# Patient Record
Sex: Female | Born: 1980 | Race: White | Hispanic: No | Marital: Married | Smoking: Never smoker
Health system: Southern US, Community
[De-identification: ages and names within clinical notes are randomized; demographics above are authoritative.]

## PROBLEM LIST (undated history)

## (undated) DIAGNOSIS — K8020 Calculus of gallbladder without cholecystitis without obstruction: Secondary | ICD-10-CM

## (undated) DIAGNOSIS — E282 Polycystic ovarian syndrome: Secondary | ICD-10-CM

## (undated) DIAGNOSIS — F32A Depression, unspecified: Secondary | ICD-10-CM

## (undated) DIAGNOSIS — F329 Major depressive disorder, single episode, unspecified: Secondary | ICD-10-CM

## (undated) DIAGNOSIS — N879 Dysplasia of cervix uteri, unspecified: Secondary | ICD-10-CM

## (undated) DIAGNOSIS — I10 Essential (primary) hypertension: Secondary | ICD-10-CM

## (undated) DIAGNOSIS — D649 Anemia, unspecified: Secondary | ICD-10-CM

## (undated) DIAGNOSIS — E669 Obesity, unspecified: Secondary | ICD-10-CM

## (undated) DIAGNOSIS — A6000 Herpesviral infection of urogenital system, unspecified: Secondary | ICD-10-CM

## (undated) HISTORY — DX: Polycystic ovarian syndrome: E28.2

## (undated) HISTORY — DX: Obesity, unspecified: E66.9

## (undated) HISTORY — DX: Major depressive disorder, single episode, unspecified: F32.9

## (undated) HISTORY — DX: Herpesviral infection of urogenital system, unspecified: A60.00

## (undated) HISTORY — DX: Dysplasia of cervix uteri, unspecified: N87.9

## (undated) HISTORY — DX: Depression, unspecified: F32.A

---

## 2005-11-22 ENCOUNTER — Emergency Department (HOSPITAL_COMMUNITY): Admission: EM | Admit: 2005-11-22 | Discharge: 2005-11-22 | Payer: Self-pay | Admitting: Emergency Medicine

## 2005-12-28 ENCOUNTER — Emergency Department (HOSPITAL_COMMUNITY): Admission: EM | Admit: 2005-12-28 | Discharge: 2005-12-28 | Payer: Self-pay | Admitting: Emergency Medicine

## 2006-02-06 ENCOUNTER — Emergency Department (HOSPITAL_COMMUNITY): Admission: EM | Admit: 2006-02-06 | Discharge: 2006-02-06 | Payer: Self-pay | Admitting: Family Medicine

## 2006-05-17 ENCOUNTER — Emergency Department (HOSPITAL_COMMUNITY): Admission: EM | Admit: 2006-05-17 | Discharge: 2006-05-17 | Payer: Self-pay | Admitting: Family Medicine

## 2006-07-28 ENCOUNTER — Inpatient Hospital Stay (HOSPITAL_COMMUNITY): Admission: AD | Admit: 2006-07-28 | Discharge: 2006-07-28 | Payer: Self-pay | Admitting: Obstetrics and Gynecology

## 2006-10-03 ENCOUNTER — Inpatient Hospital Stay (HOSPITAL_COMMUNITY): Admission: AD | Admit: 2006-10-03 | Discharge: 2006-10-03 | Payer: Self-pay | Admitting: Obstetrics & Gynecology

## 2006-10-10 ENCOUNTER — Inpatient Hospital Stay (HOSPITAL_COMMUNITY): Admission: AD | Admit: 2006-10-10 | Discharge: 2006-10-10 | Payer: Self-pay | Admitting: Obstetrics & Gynecology

## 2006-10-11 ENCOUNTER — Encounter (INDEPENDENT_AMBULATORY_CARE_PROVIDER_SITE_OTHER): Payer: Self-pay | Admitting: Obstetrics and Gynecology

## 2006-10-11 ENCOUNTER — Inpatient Hospital Stay (HOSPITAL_COMMUNITY): Admission: RE | Admit: 2006-10-11 | Discharge: 2006-10-14 | Payer: Self-pay | Admitting: Obstetrics and Gynecology

## 2007-02-26 ENCOUNTER — Emergency Department (HOSPITAL_COMMUNITY): Admission: EM | Admit: 2007-02-26 | Discharge: 2007-02-26 | Payer: Self-pay | Admitting: Emergency Medicine

## 2007-11-14 ENCOUNTER — Emergency Department (HOSPITAL_COMMUNITY): Admission: EM | Admit: 2007-11-14 | Discharge: 2007-11-14 | Payer: Self-pay | Admitting: Family Medicine

## 2008-02-19 ENCOUNTER — Emergency Department (HOSPITAL_COMMUNITY): Admission: EM | Admit: 2008-02-19 | Discharge: 2008-02-19 | Payer: Self-pay | Admitting: Emergency Medicine

## 2008-04-25 HISTORY — PX: TUBAL LIGATION: SHX77

## 2009-04-02 ENCOUNTER — Ambulatory Visit (HOSPITAL_COMMUNITY): Admission: RE | Admit: 2009-04-02 | Discharge: 2009-04-02 | Payer: Self-pay | Admitting: Obstetrics and Gynecology

## 2009-06-11 ENCOUNTER — Emergency Department (HOSPITAL_COMMUNITY): Admission: EM | Admit: 2009-06-11 | Discharge: 2009-06-11 | Payer: Self-pay | Admitting: Emergency Medicine

## 2010-07-27 LAB — TYPE AND SCREEN

## 2010-07-27 LAB — CBC
HCT: 40.9 % (ref 36.0–46.0)
MCHC: 33.6 g/dL (ref 30.0–36.0)
Platelets: 208 10*3/uL (ref 150–400)
RDW: 12.6 % (ref 11.5–15.5)

## 2010-08-06 ENCOUNTER — Ambulatory Visit: Payer: Self-pay | Admitting: Family Medicine

## 2010-09-07 NOTE — Op Note (Signed)
[name], ROSTAD                ACCOUNT NO.:  [id number]   MEDICAL RECORD NO.:  [id number]          PATIENT TYPE:  INP   LOCATION:  9105                          FACILITY:  [facility]   PHYSICIAN:  Maxie Better, M.D.DATE OF BIRTH:  June 12, 1980   DATE OF PROCEDURE:  10/11/2006  DATE OF DISCHARGE:                               OPERATIVE REPORT   PREOPERATIVE DIAGNOSIS:  Previous cesarean section, pregnancy-induced  hypertension, intrauterine gestation at 38 weeks.   PROCEDURES:  Repeat cesarean section Kerr hysterotomy.   POSTOPERATIVE DIAGNOSIS:  Previous cesarean section, term gestation.  Pregnancy induced hypertension.   ANESTHESIA:  Spinal.   SURGEON:  Maxie Better, M.D.   ASSISTANT:  Marlinda Mike, C.N.M.   PROCEDURE:  Under adequate spinal anesthesia the patient is placed in  the supine position with a left lateral tilt.  She was sterilely prepped  and draped in usual fashion.  Indwelling Foley catheter was sterilely  placed.  Quarter percent Marcaine was injected along the previous  Pfannenstiel skin incision site.  Pfannenstiel skin incision was made  through the previous scar and carried down to the rectus fascia.  The  rectus fascia was opened transversely.  The rectus fascia was then  bluntly and sharply dissected off the rectus muscle in superior and  inferior fashion.  The rectus muscle was fused in the midline and that  was carefully opened superiorly and inferiorly. The parietal peritoneum  was entered bluntly and extended superiorly and inferiorly.  The  vesicouterine peritoneum was opened transversely.  The bladder was  bluntly dissected off the lower uterine segment.  The self-retaining  retractor for cesarean section was placed.  A curvilinear low transverse  uterine incision was then made and extended with bandage scissors.  Artificial rupture of membranes was performed.  Clear copious amniotic  fluid was noted.  Subsequent delivery of a live female  from the occiput  anterior presentation was accomplished.  Baby was bulb suctioned on the  abdomen. Cord was clamped, cut. The baby was transferred to the waiting  pediatricians who was assigned Apgars of 9 and 9 at 1 and 5 minutes. The  placenta which was posterior was spontaneous and was intact, sent to  pathology.  Uterine cavity was cleaned of the debris.  Uterine incision  had no extension.  Uterine incision was closed in two layers the first  layer 0 Monocryl running locked stitch.  Second layer was imbricated  using 0 Monocryl suture. The abdomen was then copiously irrigated and  suctioned.  The self retaining retractor was removed.  Normal tubes and  ovaries were noted.  Bilateral omental adhesion anteriorly on the  anterior wall on the left was lysed.  The parietal peritoneum was closed  with 2-0 Vicryl suture.  The rectus fascia was closed with 0 Vicryl x2.  The scar tissue in the subcutaneous area was removed.  The subcutaneous  area was irrigated, small bleeders cauterized and the subcutaneous area  was closed with several layers of 2-0 plain sutures.  Skin was  approximated using Ethicon staples.  Specimen was placenta sent to  pathology.  Estimated blood loss was 700 mL.  Intraoperative fluid was 3 liters, urine output was 100 mL clear yellow  urine.  Sponge and instrument counts x2 was correct.  Complication was  none.  The patient tolerated procedure well and was transferred to  recovery in stable condition.      Maxie Better, M.D.  Electronically Signed     [place]/MEDQ  D:  10/11/2006  T:  10/11/2006  Job:  [id number]

## 2010-09-10 NOTE — Discharge Summary (Signed)
[name], [name]                ACCOUNT NO.:  [id number]   MEDICAL RECORD NO.:  [id number]          PATIENT TYPE:  INP   LOCATION:  9105                          [facility]  [facility]   PHYSICIAN:  Maxie Better, M.D.DATE OF BIRTH:  1981-01-30   DATE OF ADMISSION:  10/11/2006  DATE OF DISCHARGE:  10/14/2006                               DISCHARGE SUMMARY   ADMISSION DIAGNOSES:  1. Previous cesarean section.  2. Intrauterine gestation at 38 weeks.  3. Pregnancy-induced hypertension.   DISCHARGE DIAGNOSES:  1. Term gestation, delivered.  2. Previous cesarean section.  3. Pregnancy-induced hypertension.   PROCEDURE:  Repeat cesarean section.   HOSPITAL COURSE:  The patient was admitted to [facility], a 30-year-  old, G2, P1-0-0-1 female at 56 weeks who has had a previous cesarean  section.  The patient is noted to have had increased blood pressures.  Her prenatal course has been notable for gestational thrombocytopenia.  The patient was due to have a repeat cesarean section due to the  diagnosis of PIH.  The patient was taken to the operating room where she  had a repeat cesarean section, live female, 7 pounds 7 ounces was born  with Apgar's of 9 and 9, normal tubes and ovaries are noted.  Please see  the dictated operative note for any other details.   The patient did well postoperatively.  Her blood pressures ranged on  postop day #1 between 128-153/80-87.  CBC on postop day #1 showed a  hemoglobin of 10.5, hematocrit of 31.4, platelet count 149,000, white  count 10.1.  The patient did not require any magnesium sulfate.  By  postop day #3, the patient continued to do well.  Her blood pressures  were stable.  Her incision had no erythema, induration or exudate.  Staples were still in place.  She was deemed well enough to be  discharged home.   DISPOSITION:  Home.   CONDITION ON DISCHARGE:  Stable.   DISCHARGE MEDICATIONS:  1. Motrin 800 mg one p.o. q.6-8h. p.r.n.  pain.  2. Tylox one to two tablets every 3-4 hours p.r.n. pain.  3. Continue prenatal vitamins one p.o. daily.   SPECIAL INSTRUCTIONS:  Discharge instructions per the postpartum booklet  given.   FOLLOW UP:  Followup appointment at [facility] in 6 weeks and for  staple removal the following Tuesday.      Maxie Better, M.D.  Electronically Signed     [place]/MEDQ  D:  11/22/2006  T:  11/23/2006  Job:  [id number]

## 2010-09-10 NOTE — Consult Note (Signed)
[name], [name]                ACCOUNT NO.:  [id number]   MEDICAL RECORD NO.:  [id number]          PATIENT TYPE:  MAT   LOCATION:  MATC                          [facility]ILITY:  [facility]   PHYSICIAN:  Lenoard Aden, M.D.DATE OF BIRTH:  01-Oct-1980   DATE OF CONSULTATION:  DATE OF DISCHARGE:                                 CONSULTATION   CHIEF COMPLAINT:  Elevated blood pressure.   HISTORY OF PRESENT ILLNESS:  She is a 30 year old Caucasian female, G2,  P1, who presents at 27 weeks to the office with elevated blood pressure  today and presents for evaluation to rule out preeclampsia.   MEDICATIONS:  She has no known drug allergies.   MEDICATIONS:  1. Prenatal vitamins.  2. Zoloft.   SOCIAL HISTORY:  She is a nonsmoker, nondrinker.  Denies __________.   PAST MEDICAL HISTORY:  1. History of C-section.  2. Pilonidal cyst removal.  3. Cryosurgery.  4. HSV, currently asymptomatic.   OB/GYN HISTORY:  Remarkable for C-section x1.   PHYSICAL EXAMINATION:  GENERAL:  She is a well-developed, well-nourished  white female in no acute distress.  VITAL SIGNS:  Blood pressure 130s to 140s over 70s.  HEENT:  Normal.  LUNGS:  Clear.  HEART:  Regular rhythm.  ABDOMEN:  Soft, gravid, nontender.  PELVIC:  Cervical exam deferred.  EXTREMITIES:  Show no cords.  NEUROLOGIC:  Cranial nerves nonfocal.  SKIN:  Intact.  NECK:  Supple with full range of motion.   DIAGNOSTIC AND LABORATORY DATA:  NST is reassuring.  Ultrasound reveals  appropriately growing fetus with normal AFI.   IMPRESSION:  A 27-week pregnancy with no evidence of preeclampsia, with  normal pregnancy-induced hypertension panel, normal ultrasound,  reassuring fetal surveillance.   PLAN:  Precautions given.  Follow up in the office within 1 week.      Lenoard Aden, M.D.  Electronically Signed     [facility]/MEDQ  D:  07/28/2006  T:  07/28/2006  Job:  [id number]

## 2011-02-01 LAB — POCT RAPID STREP A: Streptococcus, Group A Screen (Direct): POSITIVE — AB

## 2011-02-09 ENCOUNTER — Inpatient Hospital Stay (INDEPENDENT_AMBULATORY_CARE_PROVIDER_SITE_OTHER)
Admission: RE | Admit: 2011-02-09 | Discharge: 2011-02-09 | Disposition: A | Discharge status: Home / Self Care | Payer: 59 | Source: Ambulatory Visit | Attending: Emergency Medicine | Admitting: Emergency Medicine

## 2011-02-09 DIAGNOSIS — J029 Acute pharyngitis, unspecified: Secondary | ICD-10-CM

## 2011-02-09 LAB — CBC
HCT: 36.1
MCHC: 33.6
MCHC: 33.7
MCV: 92.8
MCV: 93
Platelets: 149 — ABNORMAL LOW
Platelets: 170
RBC: 3.72 — ABNORMAL LOW
RDW: 13.9
WBC: 7.8

## 2011-02-09 LAB — POCT INFECTIOUS MONO SCREEN: Mono Screen: NEGATIVE

## 2011-02-09 LAB — COMPREHENSIVE METABOLIC PANEL
ALT: 13
AST: 17
Albumin: 2.6 — ABNORMAL LOW
Alkaline Phosphatase: 102
CO2: 29
Chloride: 105
GFR calc Af Amer: >60
GFR calc non Af Amer: >60
Potassium: 3.3 — ABNORMAL LOW
Sodium: 138
Total Bilirubin: 0.3

## 2011-02-09 LAB — RPR: RPR Ser Ql: NONREACTIVE

## 2011-02-10 LAB — COMPREHENSIVE METABOLIC PANEL
ALT: 16
AST: 15
Alkaline Phosphatase: 109
CO2: 28
Calcium: 9.4
GFR calc Af Amer: >60
Glucose, Bld: 92
Potassium: 3.7
Sodium: 140
Total Protein: 5.3 — ABNORMAL LOW

## 2011-02-10 LAB — CBC
Hemoglobin: 11.8 — ABNORMAL LOW
RBC: 3.72 — ABNORMAL LOW
RDW: 13.6

## 2011-02-10 LAB — LACTATE DEHYDROGENASE: LDH: 140

## 2011-04-07 ENCOUNTER — Other Ambulatory Visit (HOSPITAL_COMMUNITY)
Admission: RE | Admit: 2011-04-07 | Discharge: 2011-04-07 | Disposition: A | Discharge status: Home / Self Care | Payer: 59 | Source: Ambulatory Visit | Attending: Obstetrics and Gynecology | Admitting: Obstetrics and Gynecology

## 2011-04-07 ENCOUNTER — Other Ambulatory Visit: Payer: Self-pay | Admitting: Obstetrics and Gynecology

## 2011-04-07 DIAGNOSIS — Z01419 Encounter for gynecological examination (general) (routine) without abnormal findings: Secondary | ICD-10-CM | POA: Insufficient documentation

## 2011-04-07 DIAGNOSIS — R87810 Cervical high risk human papillomavirus (HPV) DNA test positive: Secondary | ICD-10-CM | POA: Insufficient documentation

## 2011-05-18 ENCOUNTER — Other Ambulatory Visit: Payer: Self-pay | Admitting: Nurse Practitioner

## 2011-11-16 ENCOUNTER — Other Ambulatory Visit: Payer: Self-pay | Admitting: Obstetrics and Gynecology

## 2011-11-16 ENCOUNTER — Other Ambulatory Visit (HOSPITAL_COMMUNITY)
Admission: RE | Admit: 2011-11-16 | Discharge: 2011-11-16 | Disposition: A | Discharge status: Home / Self Care | Payer: 59 | Source: Ambulatory Visit | Attending: Obstetrics and Gynecology | Admitting: Obstetrics and Gynecology

## 2011-11-16 DIAGNOSIS — Z01419 Encounter for gynecological examination (general) (routine) without abnormal findings: Secondary | ICD-10-CM | POA: Insufficient documentation

## 2012-04-25 HISTORY — PX: COLPOSCOPY: SHX161

## 2012-05-24 ENCOUNTER — Other Ambulatory Visit (HOSPITAL_COMMUNITY)
Admission: RE | Admit: 2012-05-24 | Discharge: 2012-05-24 | Disposition: A | Discharge status: Home / Self Care | Payer: 59 | Source: Ambulatory Visit | Attending: Obstetrics and Gynecology | Admitting: Obstetrics and Gynecology

## 2012-05-24 ENCOUNTER — Other Ambulatory Visit: Payer: Self-pay | Admitting: Obstetrics and Gynecology

## 2012-05-24 DIAGNOSIS — Z01419 Encounter for gynecological examination (general) (routine) without abnormal findings: Secondary | ICD-10-CM | POA: Insufficient documentation

## 2012-05-24 DIAGNOSIS — Z1151 Encounter for screening for human papillomavirus (HPV): Secondary | ICD-10-CM | POA: Insufficient documentation

## 2012-11-15 ENCOUNTER — Other Ambulatory Visit: Payer: Self-pay | Admitting: Obstetrics and Gynecology

## 2013-08-26 ENCOUNTER — Ambulatory Visit: Payer: 59 | Admitting: Obstetrics & Gynecology

## 2013-12-30 ENCOUNTER — Emergency Department (HOSPITAL_COMMUNITY)
Admission: EM | Admit: 2013-12-30 | Discharge: 2013-12-30 | Disposition: A | Discharge status: Home / Self Care | Payer: 59 | Attending: Emergency Medicine | Admitting: Emergency Medicine

## 2013-12-30 ENCOUNTER — Emergency Department (HOSPITAL_COMMUNITY): Payer: 59

## 2013-12-30 ENCOUNTER — Encounter (HOSPITAL_COMMUNITY): Payer: Self-pay | Admitting: Emergency Medicine

## 2013-12-30 DIAGNOSIS — R110 Nausea: Secondary | ICD-10-CM | POA: Insufficient documentation

## 2013-12-30 DIAGNOSIS — J3489 Other specified disorders of nose and nasal sinuses: Secondary | ICD-10-CM | POA: Insufficient documentation

## 2013-12-30 DIAGNOSIS — Z880 Allergy status to penicillin: Secondary | ICD-10-CM | POA: Insufficient documentation

## 2013-12-30 DIAGNOSIS — J069 Acute upper respiratory infection, unspecified: Secondary | ICD-10-CM | POA: Diagnosis not present

## 2013-12-30 DIAGNOSIS — R498 Other voice and resonance disorders: Secondary | ICD-10-CM | POA: Insufficient documentation

## 2013-12-30 MED ORDER — AZITHROMYCIN 250 MG PO TABS: 250.0000 mg | ORAL_TABLET | Freq: Every day | ORAL | Status: DC

## 2013-12-30 NOTE — Discharge Instructions (Signed)
Laryngitis At the top of your windpipe is your voice box. It is the source of your voice. Inside your voice box are 2 bands of muscles called vocal cords. When you breathe, your vocal cords are relaxed and open so that air can get into the lungs. When you decide to say something, these cords come together and vibrate. The sound from these vibrations goes into your throat and comes out through your mouth as sound. Laryngitis is an inflammation of the vocal cords that causes hoarseness, cough, loss of voice, sore throat, and dry throat. Laryngitis can be temporary (acute) or long-term (chronic). Most cases of acute laryngitis improve with time.Chronic laryngitis lasts for more than 3 weeks. CAUSES Laryngitis can often be related to excessive smoking, talking, or yelling, as well as inhalation of toxic fumes and allergies. Acute laryngitis is usually caused by a viral infection, vocal strain, measles or mumps, or bacterial infections. Chronic laryngitis is usually caused by vocal cord strain, vocal cord injury, postnasal drip, growths on the vocal cords, or acid reflux. SYMPTOMS   Cough.  Sore throat.  Dry throat. RISK FACTORS  Respiratory infections.  Exposure to irritating substances, such as cigarette smoke, excessive amounts of alcohol, stomach acids, and workplace chemicals.  Voice trauma, such as vocal cord injury from shouting or speaking too loud. DIAGNOSIS  Your cargiver will perform a physical exam. During the physical exam, your caregiver will examine your throat. The most common sign of laryngitis is hoarseness. Laryngoscopy may be necessary to confirm the diagnosis of this condition. This procedure allows your caregiver to look into the larynx. HOME CARE INSTRUCTIONS  Drink enough fluids to keep your urine clear or pale yellow.  Rest until you no longer have symptoms or as directed by your caregiver.  Breathe in moist air.  Take all medicine as directed by your  caregiver.  Do not smoke.  Talk as little as possible (this includes whispering).  Write on paper instead of talking until your voice is back to normal.  Follow up with your caregiver if your condition has not improved after 10 days. SEEK MEDICAL CARE IF:   You have trouble breathing.  You cough up blood.  You have persistent fever.  You have increasing pain.  You have difficulty swallowing. MAKE SURE YOU:  Understand these instructions.  Will watch your condition.  Will get help right away if you are not doing well or get worse. Document Released: 04/11/2005 Document Revised: 07/04/2011 Document Reviewed: 06/17/2010 [facility] Patient Information 2015 [place], [place]. This information is not intended to replace advice given to you by your health care provider. Make sure you discuss any questions you have with your health care provider. Upper Respiratory Infection, Adult An upper respiratory infection (URI) is also known as the common cold. It is often caused by a type of germ (virus). Colds are easily spread (contagious). You can pass it to others by kissing, coughing, sneezing, or drinking out of the same glass. Usually, you get better in 1 or 2 weeks.  HOME CARE   Only take medicine as told by your doctor.  Use a warm mist humidifier or breathe in steam from a hot shower.  Drink enough water and fluids to keep your pee (urine) clear or pale yellow.  Get plenty of rest.  Return to work when your temperature is back to normal or as told by your doctor. You may use a face mask and wash your hands to stop your cold from spreading. GET  GET HELP RIGHT AWAY IF:  °· After the first few days, you feel you are getting worse. °· You have questions about your medicine. °· You have chills, shortness of breath, or brown or red spit (mucus). °· You have yellow or brown snot (nasal discharge) or pain in the face, especially when you bend forward. °· You have a fever, puffy (swollen) neck,  pain when you swallow, or white spots in the back of your throat. °· You have a bad headache, ear pain, sinus pain, or chest pain. °· You have a high-pitched whistling sound when you breathe in and out (wheezing). °· You have a lasting cough or cough up blood. °· You have sore muscles or a stiff neck. °MAKE SURE YOU:  °· Understand these instructions. °· Will watch your condition. °· Will get help right away if you are not doing well or get worse. °Document Released: 09/28/2007 Document Revised: 07/04/2011 Document Reviewed: 07/17/2013 °[employer]® Patient Information ©2015 [employer]. This information is not intended to replace advice given to you by your health care provider. Make sure you discuss any questions you have with your health care provider. ° °

## 2013-12-30 NOTE — ED Provider Notes (Signed)
Medical screening examination/treatment/procedure(s) were performed by non-physician practitioner and as supervising physician I was immediately available for consultation/collaboration.    Vida Roller, MD 12/30/13 [phone]

## 2013-12-30 NOTE — ED Notes (Signed)
Per pt sts congestion that started in her sinuses and has moved to her chest. sts green sputum.

## 2013-12-30 NOTE — ED Provider Notes (Signed)
CSN: [id number]     Arrival date & time 12/30/13  1144 History  This chart was scribed for non-physician practitioner Roxy Horseman, PA-C working with Vida Roller, MD by Leone Payor, ED Scribe. This patient was seen in room TR10C/TR10C and the patient's care was started at 1:23 PM.    Chief Complaint  Patient presents with  . URI    The history is provided by the patient. No language interpreter was used.    HPI Comments: [name] is a 33 y.o. female who presents to the Emergency Department complaining of 1 week of gradual onset, constant, gradually worsened congestion with associated rhinorrhea and cough productive of green sputum. She also reports associated hoarse voice. She has taken sudafed and mucinex with mild relief. She denies fever, chills, vomiting, chest pain, SOB.   History reviewed. No pertinent past medical history. Past Surgical History  Procedure Laterality Date  . Cesarean section    . Tubal ligation     History reviewed. No pertinent family history. History  Substance Use Topics  . Smoking status: Never Smoker   . Smokeless tobacco: Not on file  . Alcohol Use: No   OB History   Grav Para Term Preterm Abortions TAB SAB Ect Mult Living                 Review of Systems  HENT: Positive for congestion, rhinorrhea and voice change (hoarse).   Respiratory: Positive for cough. Negative for shortness of breath.   Cardiovascular: Negative for chest pain.  Gastrointestinal: Positive for nausea. Negative for vomiting.      Allergies  Penicillins  Home Medications   Prior to Admission medications   Not on File   BP 176/83  Pulse 84  Temp(Src) 98.5 F (36.9 C) (Oral)  Resp 16  Ht  (1.676 m)  Wt 246 lb (111.585 kg)  BMI 39.72 kg/m2  SpO2 98%  LMP 12/27/2013 Physical Exam  Nursing note and vitals reviewed. Constitutional: She is oriented to person, place, and time. She appears well-developed and well-nourished.  HENT:  Head: Normocephalic  and atraumatic.  Mouth/Throat: Oropharynx is clear and moist and mucous membranes are normal. No oropharyngeal exudate, posterior oropharyngeal edema, posterior oropharyngeal erythema or tonsillar abscesses.  No abscess, no stridor, oropharynx is clear  Cardiovascular: Normal rate, regular rhythm and normal heart sounds.  Exam reveals no gallop and no friction rub.   No murmur heard. Pulmonary/Chest: Effort normal and breath sounds normal. No respiratory distress. She has no wheezes. She has no rales.  Abdominal: She exhibits no distension.  Neurological: She is alert and oriented to person, place, and time.  Skin: Skin is warm and dry.  Psychiatric: She has a normal mood and affect.    ED Course  Procedures (including critical care time)  DIAGNOSTIC STUDIES: Oxygen Saturation is 98% on RA, normal by my interpretation.    COORDINATION OF CARE: 1:27 PM Discussed treatment plan with pt at bedside and pt agreed to plan.   Labs Review Labs Reviewed - No data to display  Imaging Review Dg Chest 2 View  12/30/2013   CLINICAL DATA:  Cough and congestion for 4 days  EXAM: CHEST  2 VIEW  COMPARISON:  None.  FINDINGS: The heart size and mediastinal contours are within normal limits. Both lungs are clear but lung volumes are low with crowding of the bronchovascular markings. The visualized skeletal structures are unremarkable.  IMPRESSION: No active cardiopulmonary disease.   Electronically Signed  By: Christiana Pellant M.D.   On: 12/30/2013 13:16     EKG Interpretation None      MDM   Final diagnoses:  URI (upper respiratory infection)    Pt CXR negative for acute infiltrate. Patients symptoms are consistent with URI, likely viral etiology.  Pt will be discharged with symptomatic treatment.  Verbalizes understanding and is agreeable with plan. Pt is hemodynamically stable & in NAD prior to dc.   I personally performed the services described in this documentation, which was scribed in  my presence. The recorded information has been reviewed and is accurate.     Roxy Horseman, PA-C 12/30/13 1330

## 2014-02-04 ENCOUNTER — Ambulatory Visit: Payer: 59 | Admitting: *Deleted

## 2014-02-18 ENCOUNTER — Ambulatory Visit: Payer: 59 | Admitting: *Deleted

## 2014-03-24 ENCOUNTER — Ambulatory Visit: Payer: 59 | Admitting: *Deleted

## 2014-11-21 ENCOUNTER — Emergency Department (HOSPITAL_COMMUNITY)
Admission: EM | Admit: 2014-11-21 | Discharge: 2014-11-21 | Disposition: A | Discharge status: Home / Self Care | Payer: 59 | Attending: Emergency Medicine | Admitting: Emergency Medicine

## 2014-11-21 ENCOUNTER — Encounter (HOSPITAL_COMMUNITY): Payer: Self-pay | Admitting: General Practice

## 2014-11-21 DIAGNOSIS — Y999 Unspecified external cause status: Secondary | ICD-10-CM | POA: Insufficient documentation

## 2014-11-21 DIAGNOSIS — X58XXXA Exposure to other specified factors, initial encounter: Secondary | ICD-10-CM | POA: Diagnosis not present

## 2014-11-21 DIAGNOSIS — S161XXA Strain of muscle, fascia and tendon at neck level, initial encounter: Secondary | ICD-10-CM | POA: Diagnosis not present

## 2014-11-21 DIAGNOSIS — Z880 Allergy status to penicillin: Secondary | ICD-10-CM | POA: Insufficient documentation

## 2014-11-21 DIAGNOSIS — Y939 Activity, unspecified: Secondary | ICD-10-CM | POA: Insufficient documentation

## 2014-11-21 DIAGNOSIS — M542 Cervicalgia: Secondary | ICD-10-CM | POA: Diagnosis present

## 2014-11-21 DIAGNOSIS — Y929 Unspecified place or not applicable: Secondary | ICD-10-CM | POA: Insufficient documentation

## 2014-11-21 DIAGNOSIS — Z8639 Personal history of other endocrine, nutritional and metabolic disease: Secondary | ICD-10-CM | POA: Insufficient documentation

## 2014-11-21 MED ORDER — METHOCARBAMOL 1000 MG/10ML IJ SOLN: 500.0000 mg | Freq: Once | INTRAMUSCULAR | Status: DC

## 2014-11-21 MED ORDER — KETOROLAC TROMETHAMINE 30 MG/ML IJ SOLN
60.0000 mg | Freq: Once | INTRAMUSCULAR | Status: AC
  Administered 2014-11-21: 60 mg via INTRAMUSCULAR
  Filled 2014-11-21: qty 2

## 2014-11-21 MED ORDER — CYCLOBENZAPRINE HCL 10 MG PO TABS: 10.0000 mg | ORAL_TABLET | Freq: Three times a day (TID) | ORAL | Status: DC | PRN

## 2014-11-21 MED ORDER — ORPHENADRINE CITRATE 30 MG/ML IJ SOLN: 60.0000 mg | Freq: Two times a day (BID) | INTRAMUSCULAR | Status: DC

## 2014-11-21 NOTE — ED Notes (Signed)
Pt complaining of neck pain that started Sunday evening. Pain is worse with movement, pt denies any traumatic event. Pt is A/O. Pt denies N/V, or HA. Pt reporting left arm numbness and tingling that started last night.

## 2014-11-21 NOTE — ED Notes (Signed)
Pt getting undressed and into a gown; 2 warm blankets given; Baxter Hire, NT aware

## 2014-11-21 NOTE — ED Provider Notes (Signed)
CSN: [id number]     Arrival date & time 11/21/14  4098 History   First MD Initiated Contact with Patient 11/21/14 1000     Chief Complaint  Patient presents with  . Neck Pain     (Consider location/radiation/quality/duration/timing/severity/associated sxs/prior Treatment) HPI  Ms. Shamoon is a 34yo F with PMH of PCOS who presents with progressively worsening neck pain over the past 5 days along with 'pins and needles' in her left arm that started last night. She denies any trauma or excessive activity preceding when her symptoms started 5 days ago. She describes the pain as a sharp stabbing pain in her lower neck that radiates to her shoulders, particularly worse when she turns her head to the right. She has tried ibuprofen and Aleve as well as heat and cold, none of which have helped. She says lying flat on her back does help. The pain is now 'almost unbearable', an 8/10, and she came in today because of the new numbness and tingling in her arm that started last night. She denies trauma, fevers, chills, weight changes, headache, visual symptoms, bowel or bladder symptoms, or any weakness. She has no other issues at this time.  Past Medical History  Diagnosis Date  . Polycystic ovary syndrome    Past Surgical History  Procedure Laterality Date  . Cesarean section    . Tubal ligation     No family history on file. History  Substance Use Topics  . Smoking status: Never Smoker   . Smokeless tobacco: Not on file  . Alcohol Use: No   OB History    No data available     Review of Systems  Constitutional: Negative for fever, chills and diaphoresis.  HENT: Negative for ear pain, sinus pressure and sore throat.   Eyes: Negative for visual disturbance.  Respiratory: Negative for cough, shortness of breath and wheezing.   Cardiovascular: Negative for chest pain, palpitations and leg swelling.  Gastrointestinal: Negative for nausea, vomiting, abdominal pain, diarrhea, constipation and blood  in stool.  Endocrine: Negative for polyuria.  Genitourinary: Negative for dysuria, urgency, frequency, flank pain, decreased urine volume and difficulty urinating.  Musculoskeletal: Positive for neck pain. Negative for back pain, gait problem and neck stiffness.  Skin: Negative for rash.  Neurological: Positive for numbness. Negative for dizziness, syncope, weakness and headaches.  Hematological: Does not bruise/bleed easily.  Psychiatric/Behavioral: Negative for confusion and agitation.  All other systems reviewed and are negative.     Allergies  Penicillins  Home Medications   Prior to Admission medications   Medication Sig Start Date End Date Taking? Authorizing Provider  azithromycin (ZITHROMAX Z-PAK) 250 MG tablet Take 1 tablet (250 mg total) by mouth daily.  PO day 1, then  PO days 205 12/30/13   Roxy Horseman, PA-C   BP 161/104 mmHg  Pulse 72  Temp(Src) 98.8 F (37.1 C) (Oral)  Resp 18  Ht  (1.702 m)  Wt 265 lb (120.203 kg)  BMI 41.50 kg/m2  SpO2 99%  LMP 11/05/2014 Physical Exam  Constitutional: She is oriented to person, place, and time. She appears well-nourished. No distress.  HENT:  Head: Normocephalic and atraumatic.  Right Ear: External ear normal.  Left Ear: External ear normal.  Eyes: Conjunctivae are normal. Pupils are equal, round, and reactive to light.  Neck: Normal range of motion. Neck supple.  Cardiovascular: Normal rate, regular rhythm, normal heart sounds and intact distal pulses.  Exam reveals no gallop and no friction rub.  No murmur heard. Pulmonary/Chest: Effort normal and breath sounds normal. She has no wheezes. She has no rales.  Abdominal: Soft. Bowel sounds are normal. She exhibits no distension. There is no tenderness.  Musculoskeletal: She exhibits no edema or tenderness.  Neck is non-tender throughout. There is no bony or paravertebral tenderness. Spurling's negative on both sides. Lhermitte's sign negative. Pain worse  with rotation of head to the right, decreased ROM. Normal head rotation to the left.   Neurological: She is alert and oriented to person, place, and time. She has normal reflexes. No cranial nerve deficit. Coordination normal.  Decreased sensation to light touch in the posterior left arm, down the posterior forearm, and the entire hand. Strength is 5/5 symmetric throughout both upper extremities and lower extremities.  Skin: Skin is warm. No rash noted. She is not diaphoretic. No erythema.  Psychiatric: She has a normal mood and affect. Her behavior is normal.  Nursing note and vitals reviewed.   ED Course  Procedures (including critical care time) Labs Review Labs Reviewed - No data to display  Imaging Review No results found.   EKG Interpretation None      MDM   Final diagnoses:  None    Ms. Woolford is a 34yo F with PMH of PCOS who presents with progressively worsening neck pain over the past 5 days along with 'pins and needles' in her left arm that started last night without a history of trauma or constitutional signs. Exam shows worsened symptoms with head rotation opposite of her affected arm with intact strength. Most likely cervical strain, however peripheral neuropathy may also explain symptoms. We have discussed with the patient that an MRI of her neck may be warranted if her symptoms do not resolve, but would not change management here in the ED. We have given IM toradol with good response with instructions to follow-up outpatient for further management.    Darrick Huntsman, MD 11/21/14 1051  Darrick Huntsman, MD 11/21/14 1053  Darrick Huntsman, MD 11/21/14 1610  Lyndal Pulley, MD 11/21/14 1332

## 2014-11-21 NOTE — ED Provider Notes (Signed)
I saw and evaluated the patient, reviewed the resident's note and I agree with the findings and plan.  34 year old otherwise healthy female presents with nontraumatic neck pain and paresthesia of left outer arm. No hard neurologic signs or symptoms and no indication for emergent MRI. Will treat conservatively with NSAIDs and muscle relaxants. PCP follow-up for outpatient imaging as indicated.   EKG Interpretation None        Lyndal Pulley, MD 11/21/14 1036

## 2014-11-21 NOTE — Discharge Instructions (Signed)
Cervical Sprain A cervical sprain is when the tissues (ligaments) that hold the neck bones in place stretch or tear. HOME CARE   Put ice on the injured area.  Put ice in a plastic bag.  Place a towel between your skin and the bag.  Leave the ice on for 15-20 minutes, 3-4 times a day.  You may have been given a collar to wear. This collar keeps your neck from moving while you heal.  Do not take the collar off unless told by your doctor.  If you have long hair, keep it outside of the collar.  Ask your doctor before changing the position of your collar. You may need to change its position over time to make it more comfortable.  If you are allowed to take off the collar for cleaning or bathing, follow your doctor's instructions on how to do it safely.  Keep your collar clean by wiping it with mild soap and water. Dry it completely. If the collar has removable pads, remove them every 1-2 days to hand wash them with soap and water. Allow them to air dry. They should be dry before you wear them in the collar.  Do not drive while wearing the collar.  Only take medicine as told by your doctor.  Keep all doctor visits as told.  Keep all physical therapy visits as told.  Adjust your work station so that you have good posture while you work.  Avoid positions and activities that make your problems worse.  Warm up and stretch before being active. GET HELP IF:  Your pain is not controlled with medicine.  You cannot take less pain medicine over time as planned.  Your activity level does not improve as expected. GET HELP RIGHT AWAY IF:   You are bleeding.  Your stomach is upset.  You have an allergic reaction to your medicine.  You develop new problems that you cannot explain.  You lose feeling (become numb) or you cannot move any part of your body (paralysis).  You have tingling or weakness in any part of your body.  Your symptoms get worse. Symptoms include:  Pain,  soreness, stiffness, puffiness (swelling), or a burning feeling in your neck.  Pain when your neck is touched.  Shoulder or upper back pain.  Limited ability to move your neck.  Headache.  Dizziness.  Your hands or arms feel week, lose feeling, or tingle.  Muscle spasms.  Difficulty swallowing or chewing. MAKE SURE YOU:   Understand these instructions.  Will watch your condition.  Will get help right away if you are not doing well or get worse. Document Released: 09/28/2007 Document Revised: 12/12/2012 Document Reviewed: 10/17/2012 ExitCare Patient Information 2015 [employer]. This information is not intended to replace advice given to you by your health care provider. Make sure you discuss any questions you have with your health care provider.  

## 2015-01-16 ENCOUNTER — Encounter (HOSPITAL_COMMUNITY): Payer: Self-pay | Admitting: Cardiology

## 2015-01-16 ENCOUNTER — Emergency Department (HOSPITAL_COMMUNITY): Payer: 59

## 2015-01-16 ENCOUNTER — Emergency Department (HOSPITAL_COMMUNITY)
Admission: EM | Admit: 2015-01-16 | Discharge: 2015-01-16 | Disposition: A | Discharge status: Home / Self Care | Payer: 59 | Attending: Emergency Medicine | Admitting: Emergency Medicine

## 2015-01-16 DIAGNOSIS — R51 Headache: Secondary | ICD-10-CM | POA: Insufficient documentation

## 2015-01-16 DIAGNOSIS — I10 Essential (primary) hypertension: Secondary | ICD-10-CM | POA: Diagnosis not present

## 2015-01-16 DIAGNOSIS — Z79899 Other long term (current) drug therapy: Secondary | ICD-10-CM | POA: Insufficient documentation

## 2015-01-16 DIAGNOSIS — Z880 Allergy status to penicillin: Secondary | ICD-10-CM | POA: Insufficient documentation

## 2015-01-16 HISTORY — DX: Essential (primary) hypertension: I10

## 2015-01-16 LAB — CBC WITH DIFFERENTIAL/PLATELET
BASOS PCT: 0 %
Basophils Absolute: 0 10*3/uL (ref 0.0–0.1)
EOS ABS: 0.1 10*3/uL (ref 0.0–0.7)
Eosinophils Relative: 1 %
HCT: 37.5 % (ref 36.0–46.0)
Hemoglobin: 12.6 g/dL (ref 12.0–15.0)
Lymphocytes Relative: 38 %
Lymphs Abs: 2.5 10*3/uL (ref 0.7–4.0)
MCH: 29.6 pg (ref 26.0–34.0)
MCHC: 33.6 g/dL (ref 30.0–36.0)
MCV: 88 fL (ref 78.0–100.0)
MONOS PCT: 8 %
Monocytes Absolute: 0.5 10*3/uL (ref 0.1–1.0)
Neutro Abs: 3.5 10*3/uL (ref 1.7–7.7)
Neutrophils Relative %: 53 %
PLATELETS: 213 10*3/uL (ref 150–400)
RBC: 4.26 MIL/uL (ref 3.87–5.11)
RDW: 12.6 % (ref 11.5–15.5)
WBC: 6.6 10*3/uL (ref 4.0–10.5)

## 2015-01-16 LAB — BASIC METABOLIC PANEL
Anion gap: 7 (ref 5–15)
BUN: 8 mg/dL (ref 6–20)
CALCIUM: 9.3 mg/dL (ref 8.9–10.3)
CO2: 29 mmol/L (ref 22–32)
CREATININE: 0.69 mg/dL (ref 0.44–1.00)
Chloride: 103 mmol/L (ref 101–111)
GFR calc non Af Amer: >60 mL/min (ref >60)
GLUCOSE: 98 mg/dL (ref 65–99)
Potassium: 4.3 mmol/L (ref 3.5–5.1)
Sodium: 139 mmol/L (ref 135–145)

## 2015-01-16 MED ORDER — SODIUM CHLORIDE 0.9 % IV SOLN
INTRAVENOUS | Status: DC
  Administered 2015-01-16: 17:00:00 via INTRAVENOUS

## 2015-01-16 MED ORDER — AMLODIPINE BESYLATE 10 MG PO TABS: 10.0000 mg | ORAL_TABLET | Freq: Every day | ORAL | Status: DC

## 2015-01-16 MED ORDER — SODIUM CHLORIDE 0.9 % IV BOLUS (SEPSIS)
500.0000 mL | Freq: Once | INTRAVENOUS | Status: AC
  Administered 2015-01-16: 500 mL via INTRAVENOUS

## 2015-01-16 MED ORDER — KETOROLAC TROMETHAMINE 30 MG/ML IJ SOLN
30.0000 mg | Freq: Once | INTRAMUSCULAR | Status: AC
  Administered 2015-01-16: 30 mg via INTRAVENOUS
  Filled 2015-01-16: qty 1

## 2015-01-16 NOTE — ED Notes (Signed)
Pt. Going to CT

## 2015-01-16 NOTE — Discharge Instructions (Signed)
DASH Eating Plan DASH stands for "Dietary Approaches to Stop Hypertension." The DASH eating plan is a healthy eating plan that has been shown to reduce high blood pressure (hypertension). Additional health benefits may include reducing the risk of type 2 diabetes mellitus, heart disease, and stroke. The DASH eating plan may also help with weight loss. WHAT DO I NEED TO KNOW ABOUT THE DASH EATING PLAN? For the DASH eating plan, you will follow these general guidelines:  Choose foods with a percent daily value for sodium of less than 5% (as listed on the food label).  Use salt-free seasonings or herbs instead of table salt or sea salt.  Check with your health care provider or pharmacist before using salt substitutes.  Eat lower-sodium products, often labeled as "lower sodium" or "no salt added."  Eat fresh foods.  Eat more vegetables, fruits, and low-fat dairy products.  Choose whole grains. Look for the word "whole" as the first word in the ingredient list.  Choose fish and skinless chicken or turkey more often than red meat. Limit fish, poultry, and meat to 6 oz (170 g) each day.  Limit sweets, desserts, sugars, and sugary drinks.  Choose heart-healthy fats.  Limit cheese to 1 oz (28 g) per day.  Eat more home-cooked food and less restaurant, buffet, and fast food.  Limit fried foods.  Cook foods using methods other than frying.  Limit canned vegetables. If you do use them, rinse them well to decrease the sodium.  When eating at a restaurant, ask that your food be prepared with less salt, or no salt if possible. WHAT FOODS CAN I EAT? Seek help from a dietitian for individual calorie needs. Grains Whole grain or whole wheat bread. Brown rice. Whole grain or whole wheat pasta. Quinoa, bulgur, and whole grain cereals. Low-sodium cereals. Corn or whole wheat flour tortillas. Whole grain cornbread. Whole grain crackers. Low-sodium crackers. Vegetables Fresh or frozen vegetables  (raw, steamed, roasted, or grilled). Low-sodium or reduced-sodium tomato and vegetable juices. Low-sodium or reduced-sodium tomato sauce and paste. Low-sodium or reduced-sodium canned vegetables.  Fruits All fresh, canned (in natural juice), or frozen fruits. Meat and Other Protein Products Ground beef (85% or leaner), grass-fed beef, or beef trimmed of fat. Skinless chicken or turkey. Ground chicken or turkey. Pork trimmed of fat. All fish and seafood. Eggs. Dried beans, peas, or lentils. Unsalted nuts and seeds. Unsalted canned beans. Dairy Low-fat dairy products, such as skim or 1% milk, 2% or reduced-fat cheeses, low-fat ricotta or cottage cheese, or plain low-fat yogurt. Low-sodium or reduced-sodium cheeses. Fats and Oils Tub margarines without trans fats. Light or reduced-fat mayonnaise and salad dressings (reduced sodium). Avocado. Safflower, olive, or canola oils. Natural peanut or almond butter. Other Unsalted popcorn and pretzels. The items listed above may not be a complete list of recommended foods or beverages. Contact your dietitian for more options. WHAT FOODS ARE NOT RECOMMENDED? Grains White bread. White pasta. White rice. Refined cornbread. Bagels and croissants. Crackers that contain trans fat. Vegetables Creamed or fried vegetables. Vegetables in a cheese sauce. Regular canned vegetables. Regular canned tomato sauce and paste. Regular tomato and vegetable juices. Fruits Dried fruits. Canned fruit in light or heavy syrup. Fruit juice. Meat and Other Protein Products Fatty cuts of meat. Ribs, chicken wings, bacon, sausage, bologna, salami, chitterlings, fatback, hot dogs, bratwurst, and packaged luncheon meats. Salted nuts and seeds. Canned beans with salt. Dairy Whole or 2% milk, cream, half-and-half, and cream cheese. Whole-fat or sweetened yogurt. Full-fat   cheeses or blue cheese. Nondairy creamers and whipped toppings. Processed cheese, cheese spreads, or cheese  curds. Condiments Onion and garlic salt, seasoned salt, table salt, and sea salt. Canned and packaged gravies. Worcestershire sauce. Tartar sauce. Barbecue sauce. Teriyaki sauce. Soy sauce, including reduced sodium. Steak sauce. Fish sauce. Oyster sauce. Cocktail sauce. Horseradish. Ketchup and mustard. Meat flavorings and tenderizers. Bouillon cubes. Hot sauce. Tabasco sauce. Marinades. Taco seasonings. Relishes. Fats and Oils Butter, stick margarine, lard, shortening, ghee, and bacon fat. Coconut, palm kernel, or palm oils. Regular salad dressings. Other Pickles and olives. Salted popcorn and pretzels. The items listed above may not be a complete list of foods and beverages to avoid. Contact your dietitian for more information. WHERE CAN I FIND MORE INFORMATION? National Heart, Lung, and [employer] Institute: www.nhlbi.nih.gov/health/health-topics/topics/dash/ Document Released: 03/31/2011 Document Revised: 08/26/2013 Document Reviewed: 02/13/2013 ExitCare Patient Information 2015 [employer]. This information is not intended to replace advice given to you by your health care provider. Make sure you discuss any questions you have with your health care provider. Hypertension Hypertension, commonly called high blood pressure, is when the force of blood pumping through your arteries is too strong. Your arteries are the blood vessels that carry blood from your heart throughout your body. A blood pressure reading consists of a higher number over a lower number, such as 110/72. The higher number (systolic) is the pressure inside your arteries when your heart pumps. The lower number (diastolic) is the pressure inside your arteries when your heart relaxes. Ideally you want your blood pressure below 120/80. Hypertension forces your heart to work harder to pump blood. Your arteries may become narrow or stiff. Having hypertension puts you at risk for heart disease, stroke, and other problems.  RISK  FACTORS Some risk factors for high blood pressure are controllable. Others are not.  Risk factors you cannot control include:   Race. You may be at higher risk if you are African American.  Age. Risk increases with age.  Gender. Men are at higher risk than women before age 45 years. After age 65, women are at higher risk than men. Risk factors you can control include:  Not getting enough exercise or physical activity.  Being overweight.  Getting too much fat, sugar, calories, or salt in your diet.  Drinking too much alcohol. SIGNS AND SYMPTOMS Hypertension does not usually cause signs or symptoms. Extremely high blood pressure (hypertensive crisis) may cause headache, anxiety, shortness of breath, and nosebleed. DIAGNOSIS  To check if you have hypertension, your health care provider will measure your blood pressure while you are seated, with your arm held at the level of your heart. It should be measured at least twice using the same arm. Certain conditions can cause a difference in blood pressure between your right and left arms. A blood pressure reading that is higher than normal on one occasion does not mean that you need treatment. If one blood pressure reading is high, ask your health care provider about having it checked again. TREATMENT  Treating high blood pressure includes making lifestyle changes and possibly taking medicine. Living a healthy lifestyle can help lower high blood pressure. You may need to change some of your habits. Lifestyle changes may include:  Following the DASH diet. This diet is high in fruits, vegetables, and whole grains. It is low in salt, red meat, and added sugars.  Getting at least 2 hours of brisk physical activity every week.  Losing weight if necessary.  Not smoking.  Limiting   alcoholic beverages.  Learning ways to reduce stress. If lifestyle changes are not enough to get your blood pressure under control, your health care provider may  prescribe medicine. You may need to take more than one. Work closely with your health care provider to understand the risks and benefits. HOME CARE INSTRUCTIONS  Have your blood pressure rechecked as directed by your health care provider.   Take medicines only as directed by your health care provider. Follow the directions carefully. Blood pressure medicines must be taken as prescribed. The medicine does not work as well when you skip doses. Skipping doses also puts you at risk for problems.   Do not smoke.   Monitor your blood pressure at home as directed by your health care provider. SEEK MEDICAL CARE IF:   You think you are having a reaction to medicines taken.  You have recurrent headaches or feel dizzy.  You have swelling in your ankles.  You have trouble with your vision. SEEK IMMEDIATE MEDICAL CARE IF:  You develop a severe headache or confusion.  You have unusual weakness, numbness, or feel faint.  You have severe chest or abdominal pain.  You vomit repeatedly.  You have trouble breathing. MAKE SURE YOU:   Understand these instructions.  Will watch your condition.  Will get help right away if you are not doing well or get worse. Document Released: 04/11/2005 Document Revised: 08/26/2013 Document Reviewed: 02/01/2013 ExitCare Patient Information 2015 [employer]. This information is not intended to replace advice given to you by your health care provider. Make sure you discuss any questions you have with your health care provider.  

## 2015-01-16 NOTE — ED Notes (Signed)
Pt reports she was placed on HCTZ for HTN yesterday but her BP is still high this afternoon. Reports she was told to come over here for further evaluation. Reports a headache for 2 days.

## 2015-01-16 NOTE — ED Notes (Signed)
Pt back from CT

## 2015-01-16 NOTE — ED Provider Notes (Signed)
CSN: [id number]     Arrival date & time 01/16/15  1346 History   First MD Initiated Contact with Patient 01/16/15 1511     Chief Complaint  Patient presents with  . Hypertension  . Headache     (Consider location/radiation/quality/duration/timing/severity/associated sxs/prior Treatment) The history is provided by the patient.    [name] is a 34 y.o. female who presents for evaluation of headache present for 3 days and unrelenting despite taking ibuprofen. She is concerned that her blood pressure has been elevated, in the last week. She has been checking it, at work, and it has been as high as 180/120. She saw her gynecologist, 5 days ago and was prescribed hydrochlorothiazide, which she started yesterday. She was not known to have hypertension before that visit. She plans on seeing her PCP for follow-up, next week. She denies blurred vision, dizziness, nausea, vomiting, diaphoresis, chest pain. There are no other known modifying factors.  Past Medical History  Diagnosis Date  . Polycystic ovary syndrome   . Hypertension    Past Surgical History  Procedure Laterality Date  . Cesarean section    . Tubal ligation     History reviewed. No pertinent family history. Social History  Substance Use Topics  . Smoking status: Never Smoker   . Smokeless tobacco: None  . Alcohol Use: No   OB History    No data available     Review of Systems  All other systems reviewed and are negative.     Allergies  Penicillins  Home Medications   Prior to Admission medications   Medication Sig Start Date End Date Taking? Authorizing Provider  escitalopram (LEXAPRO) 20 MG tablet Take 20 mg by mouth at bedtime.    Yes Historical Provider, MD  metFORMIN (GLUCOPHAGE) 1000 MG tablet Take 1,000 mg by mouth 2 (two) times daily with a meal.   Yes Historical Provider, MD  amLODipine (NORVASC) 10 MG tablet Take 1 tablet (10 mg total) by mouth daily. 01/16/15   Mancel Bale, MD   BP 171/117  mmHg  Pulse 85  Temp(Src) 98.4 F (36.9 C) (Oral)  Resp 20  SpO2 100%  LMP 11/29/2014 Physical Exam  Constitutional: She is oriented to person, place, and time. She appears well-developed and well-nourished.  HENT:  Head: Normocephalic and atraumatic.  Right Ear: External ear normal.  Left Ear: External ear normal.  Eyes: Conjunctivae and EOM are normal. Pupils are equal, round, and reactive to light.  Neck: Normal range of motion and phonation normal. Neck supple.  Cardiovascular: Normal rate, regular rhythm and normal heart sounds.   Pulmonary/Chest: Effort normal and breath sounds normal. She exhibits no bony tenderness.  Abdominal: Soft. There is no tenderness.  Musculoskeletal: Normal range of motion.  Neurological: She is alert and oriented to person, place, and time. No cranial nerve deficit or sensory deficit. She exhibits normal muscle tone. Coordination normal.  Skin: Skin is warm, dry and intact.  Psychiatric: She has a normal mood and affect. Her behavior is normal. Judgment and thought content normal.  Nursing note and vitals reviewed.   ED Course  Procedures (including critical care time)  Medications  sodium chloride 0.9 % bolus 500 mL (0 mLs Intravenous Stopped 01/16/15 1726)  ketorolac (TORADOL) 30 MG/ML injection 30 mg (30 mg Intravenous Given 01/16/15 1625)    Patient Vitals for the past 24 hrs:  BP Temp Temp src Pulse Resp SpO2  01/16/15 1842 (!) 171/117 mmHg - - 85 20 100 %  01/16/15 1815 (!) 171/117 mmHg - - 85 24 100 %  01/16/15 1800 (!) 182/107 mmHg - - 76 24 100 %  01/16/15 1645 (!) 171/116 mmHg - - 78 13 100 %  01/16/15 1630 (!) 182/123 mmHg - - 80 17 100 %  01/16/15 1545 (!) 155/101 mmHg - - 91 22 97 %  01/16/15 1530 166/96 mmHg - - 80 23 100 %  01/16/15 1500 164/100 mmHg - - 80 25 100 %  01/16/15 1445 (!) 169/107 mmHg - - 84 20 97 %  01/16/15 1441 (!) 165/107 mmHg - - 80 19 99 %  01/16/15 1349 (!) 177/125 mmHg 98.4 F (36.9 C) Oral 87 18 99 %     At discharge- Reevaluation with update and discussion. After initial assessment and treatment, an updated evaluation reveals she is more comfortable, states that her headache has improved, and moved toward the back of her head. Findings discussed with the patient, all questions were answered.Flint Melter    Labs Review Labs Reviewed  BASIC METABOLIC PANEL  CBC WITH DIFFERENTIAL/PLATELET    Imaging Review Ct Head Wo Contrast  01/16/2015   CLINICAL DATA:  Headache for 2 days.  Hypertension.  EXAM: CT HEAD WITHOUT CONTRAST  TECHNIQUE: Contiguous axial images were obtained from the base of the skull through the vertex without intravenous contrast.  COMPARISON:  None.  FINDINGS: The brain appears normal without hemorrhage, infarct, mass lesion, mass effect, midline shift or abnormal extra-axial fluid collection. No hydrocephalus or pneumocephalus. The calvarium is intact. Imaged paranasal sinuses and mastoid air cells are clear.  IMPRESSION: Negative head CT.   Electronically Signed   By: Drusilla Kanner M.D.   On: 01/16/2015 17:33   I have personally reviewed and evaluated these images and lab results as part of my medical decision-making.   EKG Interpretation None      MDM   Final diagnoses:  Essential hypertension    Hypertension with headache. Blood pressure spontaneously improved during her develop sedation in the emergency department. She was treated symptomatically, with improvement. Doubt hypertensive urgency, metabolic instability or CVA. She is currently being treated with hydrochlorothiazide, which has not helped her hypertension, yet. Will change to Norvasc, as it is a better medication for her.  Nursing Notes Reviewed/ Care Coordinated Applicable Imaging Reviewed Interpretation of Laboratory Data incorporated into ED treatment  The patient appears reasonably screened and/or stabilized for discharge and I doubt any other medical condition or other [facility] requiring  further screening, evaluation, or treatment in the ED at this time prior to discharge.  Plan: Home Medications- Norvasc; Home Treatments- rest; return here if the recommended treatment, does not improve the symptoms; Recommended follow up- PCP 1 week for check up   Mancel Bale, MD 01/16/15 2317

## 2015-01-23 ENCOUNTER — Encounter: Payer: Self-pay | Admitting: Adult Health

## 2015-01-23 ENCOUNTER — Ambulatory Visit (INDEPENDENT_AMBULATORY_CARE_PROVIDER_SITE_OTHER): Payer: 59 | Admitting: Adult Health

## 2015-01-23 VITALS — BP 140/90 | HR 109 | Temp 98.6°F | Ht 67.32 in | Wt 296.6 lb

## 2015-01-23 DIAGNOSIS — E669 Obesity, unspecified: Secondary | ICD-10-CM | POA: Diagnosis not present

## 2015-01-23 DIAGNOSIS — I10 Essential (primary) hypertension: Secondary | ICD-10-CM | POA: Diagnosis not present

## 2015-01-23 DIAGNOSIS — Z7189 Other specified counseling: Secondary | ICD-10-CM | POA: Diagnosis not present

## 2015-01-23 DIAGNOSIS — Z7689 Persons encountering health services in other specified circumstances: Secondary | ICD-10-CM

## 2015-01-23 LAB — BASIC METABOLIC PANEL
BUN: 14 mg/dL (ref 6–23)
CALCIUM: 9.3 mg/dL (ref 8.4–10.5)
CO2: 31 meq/L (ref 19–32)
CREATININE: 0.59 mg/dL (ref 0.40–1.20)
Chloride: 101 mEq/L (ref 96–112)
GFR: 124.73 mL/min (ref >60.00)
Glucose, Bld: 85 mg/dL (ref 70–99)
Potassium: 4.3 mEq/L (ref 3.5–5.1)
Sodium: 141 mEq/L (ref 135–145)

## 2015-01-23 MED ORDER — HYDROCHLOROTHIAZIDE 12.5 MG PO CAPS: 12.5000 mg | ORAL_CAPSULE | Freq: Once | ORAL | Status: DC

## 2015-01-23 MED ORDER — PHENTERMINE HCL 15 MG PO CAPS: 15.0000 mg | ORAL_CAPSULE | ORAL | Status: DC

## 2015-01-23 MED ORDER — AMLODIPINE BESYLATE 10 MG PO TABS: 10.0000 mg | ORAL_TABLET | Freq: Every day | ORAL | Status: DC

## 2015-01-23 NOTE — Patient Instructions (Addendum)
It was great meeting you today!  Continue to monitor your blood pressure at home and send me a mychart message next week with your readings.   Take all medications as directed  Continue to work on diet and exercise.    Follow up in three months for a check up regarding phentermine  Health Maintenance Adopting a healthy lifestyle and getting preventive care can go a long way to promote health and wellness. Talk with your health care Manhattan Mccuen about what schedule of regular examinations is right for you. This is a good chance for you to check in with your Siobhan Zaro about disease prevention and staying healthy. In between checkups, there are plenty of things you can do on your own. Experts have done a lot of research about which lifestyle changes and preventive measures are most likely to keep you healthy. Ask your health care Nechelle Petrizzo for more information. WEIGHT AND DIET  Eat a healthy diet  Be sure to include plenty of vegetables, fruits, low-fat dairy products, and lean protein.  Do not eat a lot of foods high in solid fats, added sugars, or salt.  Get regular exercise. This is one of the most important things you can do for your health.  Most adults should exercise for at least 150 minutes each week. The exercise should increase your heart rate and make you sweat (moderate-intensity exercise).  Most adults should also do strengthening exercises at least twice a week. This is in addition to the moderate-intensity exercise.  Maintain a healthy weight  Body mass index (BMI) is a measurement that can be used to identify possible weight problems. It estimates body fat based on height and weight. Your health care Lamesha Tibbits can help determine your BMI and help you achieve or maintain a healthy weight.  For females 89 years of age and older:   A BMI below 18.5 is considered underweight.  A BMI of 18.5 to 24.9 is normal.  A BMI of 25 to 29.9 is considered overweight.  A BMI of 30 and  above is considered obese.  Watch levels of cholesterol and blood lipids  You should start having your blood tested for lipids and cholesterol at 33 years of age, then have this test every 5 years.  You may need to have your cholesterol levels checked more often if:  Your lipid or cholesterol levels are high.  You are older than 33 years of age.  You are at high risk for heart disease.  CANCER SCREENING   Lung Cancer  Lung cancer screening is recommended for adults 52-44 years old who are at high risk for lung cancer because of a history of smoking.  A yearly low-dose CT scan of the lungs is recommended for people who:  Currently smoke.  Have quit within the past 15 years.  Have at least a 30-pack-year history of smoking. A pack year is smoking an average of one pack of cigarettes a day for 1 year.  Yearly screening should continue until it has been 15 years since you quit.  Yearly screening should stop if you develop a health problem that would prevent you from having lung cancer treatment.  Breast Cancer  Practice breast self-awareness. This means understanding how your breasts normally appear and feel.  It also means doing regular breast self-exams. Let your health care Teriah Muela know about any changes, no matter how small.  If you are in your 20s or 30s, you should have a clinical breast exam (CBE) by a health  care Myah Guynes every 1-3 years as part of a regular health exam.  If you are 73 or older, have a CBE every year. Also consider having a breast X-ray (mammogram) every year.  If you have a family history of breast cancer, talk to your health care Namine Beahm about genetic screening.  If you are at high risk for breast cancer, talk to your health care Kenishia Plack about having an MRI and a mammogram every year.  Breast cancer gene (BRCA) assessment is recommended for women who have family members with BRCA-related cancers. BRCA-related cancers  include:  Breast.  Ovarian.  Tubal.  Peritoneal cancers.  Results of the assessment will determine the need for genetic counseling and BRCA1 and BRCA2 testing. Cervical Cancer Routine pelvic examinations to screen for cervical cancer are no longer recommended for nonpregnant women who are considered low risk for cancer of the pelvic organs (ovaries, uterus, and vagina) and who do not have symptoms. A pelvic examination may be necessary if you have symptoms including those associated with pelvic infections. Ask your health care Mylez Venable if a screening pelvic exam is right for you.   The Pap test is the screening test for cervical cancer for women who are considered at risk.  If you had a hysterectomy for a problem that was not cancer or a condition that could lead to cancer, then you no longer need Pap tests.  If you are older than 65 years, and you have had normal Pap tests for the past 10 years, you no longer need to have Pap tests.  If you have had past treatment for cervical cancer or a condition that could lead to cancer, you need Pap tests and screening for cancer for at least 20 years after your treatment.  If you no longer get a Pap test, assess your risk factors if they change (such as having a new sexual partner). This can affect whether you should start being screened again.  Some women have medical problems that increase their chance of getting cervical cancer. If this is the case for you, your health care Kirstie Larsen may recommend more frequent screening and Pap tests.  The human papillomavirus (HPV) test is another test that may be used for cervical cancer screening. The HPV test looks for the virus that can cause cell changes in the cervix. The cells collected during the Pap test can be tested for HPV.  The HPV test can be used to screen women 48 years of age and older. Getting tested for HPV can extend the interval between normal Pap tests from three to five years.  An HPV  test also should be used to screen women of any age who have unclear Pap test results.  After 33 years of age, women should have HPV testing as often as Pap tests.  Colorectal Cancer  This type of cancer can be detected and often prevented.  Routine colorectal cancer screening usually begins at 33 years of age and continues through 33 years of age.  Your health care Dany Walther may recommend screening at an earlier age if you have risk factors for colon cancer.  Your health care Shooter Tangen may also recommend using home test kits to check for hidden blood in the stool.  A small camera at the end of a tube can be used to examine your colon directly (sigmoidoscopy or colonoscopy). This is done to check for the earliest forms of colorectal cancer.  Routine screening usually begins at age 62.  Direct examination of the  colon should be repeated every 5-10 years through 33 years of age. However, you may need to be screened more often if early forms of precancerous polyps or small growths are found. Skin Cancer  Check your skin from head to toe regularly.  Tell your health care Kenzie Thoreson about any new moles or changes in moles, especially if there is a change in a mole's shape or color.  Also tell your health care Anneli Bing if you have a mole that is larger than the size of a pencil eraser.  Always use sunscreen. Apply sunscreen liberally and repeatedly throughout the day.  Protect yourself by wearing long sleeves, pants, a wide-brimmed hat, and sunglasses whenever you are outside. HEART DISEASE, DIABETES, AND HIGH BLOOD PRESSURE   Have your blood pressure checked at least every 1-2 years. High blood pressure causes heart disease and increases the risk of stroke.  If you are between 78 years and 29 years old, ask your health care Argentina Kosch if you should take aspirin to prevent strokes.  Have regular diabetes screenings. This involves taking a blood sample to check your fasting blood sugar  level.  If you are at a normal weight and have a low risk for diabetes, have this test once every three years after 33 years of age.  If you are overweight and have a high risk for diabetes, consider being tested at a younger age or more often. PREVENTING INFECTION  Hepatitis B  If you have a higher risk for hepatitis B, you should be screened for this virus. You are considered at high risk for hepatitis B if:  You were born in a country where hepatitis B is common. Ask your health care AmeLie Hollars which countries are considered high risk.  Your parents were born in a high-risk country, and you have not been immunized against hepatitis B (hepatitis B vaccine).  You have HIV or AIDS.  You use needles to inject street drugs.  You live with someone who has hepatitis B.  You have had sex with someone who has hepatitis B.  You get hemodialysis treatment.  You take certain medicines for conditions, including cancer, organ transplantation, and autoimmune conditions. Hepatitis C  Blood testing is recommended for:  Everyone born from [birth year] through 1964.  Anyone with known risk factors for hepatitis C. Sexually transmitted infections (STIs)  You should be screened for sexually transmitted infections (STIs) including gonorrhea and chlamydia if:  You are sexually active and are younger than 33 years of age.  You are older than 33 years of age and your health care Dam Ashraf tells you that you are at risk for this type of infection.  Your sexual activity has changed since you were last screened and you are at an increased risk for chlamydia or gonorrhea. Ask your health care Whitley Strycharz if you are at risk.  If you do not have HIV, but are at risk, it may be recommended that you take a prescription medicine daily to prevent HIV infection. This is called pre-exposure prophylaxis (PrEP). You are considered at risk if:  You are sexually active and do not regularly use condoms or know the HIV status  of your partner(s).  You take drugs by injection.  You are sexually active with a partner who has HIV. Talk with your health care Josie Burleigh about whether you are at high risk of being infected with HIV. If you choose to begin PrEP, you should first be tested for HIV. You should then be tested every 3 months  for as long as you are taking PrEP.  PREGNANCY   If you are premenopausal and you may become pregnant, ask your health care Yentl Verge about preconception counseling.  If you may become pregnant, take 400 to 800 micrograms (mcg) of folic acid every day.  If you want to prevent pregnancy, talk to your health care Erle Guster about birth control (contraception). OSTEOPOROSIS AND MENOPAUSE   Osteoporosis is a disease in which the bones lose minerals and strength with aging. This can result in serious bone fractures. Your risk for osteoporosis can be identified using a bone density scan.  If you are 64 years of age or older, or if you are at risk for osteoporosis and fractures, ask your health care Nathalie Cavendish if you should be screened.  Ask your health care Vivica Dobosz whether you should take a calcium or vitamin D supplement to lower your risk for osteoporosis.  Menopause may have certain physical symptoms and risks.  Hormone replacement therapy may reduce some of these symptoms and risks. Talk to your health care Hessie Varone about whether hormone replacement therapy is right for you.  HOME CARE INSTRUCTIONS   Schedule regular health, dental, and eye exams.  Stay current with your immunizations.   Do not use any tobacco products including cigarettes, chewing tobacco, or electronic cigarettes.  If you are pregnant, do not drink alcohol.  If you are breastfeeding, limit how much and how often you drink alcohol.  Limit alcohol intake to no more than 1 drink per day for nonpregnant women. One drink equals 12 ounces of beer, 5 ounces of wine, or 1 ounces of hard liquor.  Do not use street  drugs.  Do not share needles.  Ask your health care Hetal Proano for help if you need support or information about quitting drugs.  Tell your health care Bassel Gaskill if you often feel depressed.  Tell your health care Braidan Ricciardi if you have ever been abused or do not feel safe at home. Document Released: 10/25/2010 Document Revised: 08/26/2013 Document Reviewed: 03/13/2013 [facility] Patient Information 2015 [place], [place]. This information is not intended to replace advice given to you by your health care Mihail Prettyman. Make sure you discuss any questions you have with your health care Armoni Kludt.

## 2015-01-23 NOTE — Progress Notes (Addendum)
HPI:  [name] is here to establish care. She is a very pleasant Caucasian female who  has a past medical history of Hypertension; Depression; Genital herpes; Polycystic ovaries; Dysplasia of cervix; and Obesity.  Last PCP and physical: 12/2014 with GYN   Immunizations:UTD Diet: Low sodium diet. Tries to eat healthy Exercise:Does not exercise much Pap Smear: 12/2014    Has the following chronic problems that require follow up and concerns today:  Obesity - She has PCOS and was placed on Metformin at which time she continued to gain weight. She endorses gaining 30+ pounds over the last six months.   Hypertension - This is a new diagnosis made by her GYN. Two weeks ago while at her GYN her blood pressure was 171/117/. She was taken off her birth control pills and was placed on HCTZ 12.5. A week later she followed up and her blood pressure was still elevated so she was started on Norvasc . Currently her blood pressure is 140/90 and she endorses that her BP is coming down a little bit each day.   Depression/ anxiety - Is well controlled with Lexapro.  ROS negative for unless reported above: fevers, chills,feeling poorly, unintentional weight loss, hearing or vision loss, chest pain, palpitations, leg claudication, struggling to breath,Not feeling congested in the chest, no orthopenia, no cough,no wheezing, normal appetite, no soft tissue swelling, no hemoptysis, melena, hematochezia, hematuria, falls, loc, si, or thoughts of self harm.    Past Medical History  Diagnosis Date  . Polycystic ovary syndrome   . Hypertension   . Depression   . Genital herpes   . Polycystic ovaries   . Dysplasia of cervix   . Obesity     Past Surgical History  Procedure Laterality Date  . Cesarean section  [id number]  . Tubal ligation  2010  . Colposcopy  2014    Family History  Problem Relation Age of Onset  . Thyroid disease Mother 19  . Thyroid disease Brother 73  . Breast  cancer Paternal Grandmother 45    Breast  . Breast cancer Other   . Uterine cancer Maternal Grandmother   . Thyroid disease Maternal Grandmother 25  . Ovarian cancer Maternal Grandmother   . Hypertension Maternal Grandmother     Social History   Social History  . Marital Status: Married    Spouse Name: N/A  . Number of Children: N/A  . Years of Education: N/A   Social History Main Topics  . Smoking status: Never Smoker   . Smokeless tobacco: None  . Alcohol Use: No  . Drug Use: No  . Sexual Activity: Not Asked   Other Topics Concern  . None   Social History Narrative     Current outpatient prescriptions:  .  amLODipine (NORVASC) 10 MG tablet, Take 1 tablet (10 mg total) by mouth daily., Disp: 30 tablet, Rfl: 0 .  escitalopram (LEXAPRO) 20 MG tablet, Take 20 mg by mouth at bedtime. , Disp: , Rfl:  .  hydrochlorothiazide (MICROZIDE) 12.5 MG capsule, Take 12.5 mg by mouth once., Disp: , Rfl: 1 .  metFORMIN (GLUCOPHAGE) 1000 MG tablet, Take 1,000 mg by mouth 2 (two) times daily with a meal., Disp: , Rfl:   EXAM:  Filed Vitals:   01/23/15 0857  BP: 140/90  Pulse: 109  Temp: 98.6 F (37 C)    Body mass index is 46.01 kg/(m^2).  GENERAL: vitals reviewed and listed above, alert, oriented, appears well hydrated and in no  acute distress Obese  HEENT: atraumatic, conjunttiva clear, no obvious abnormalities on inspection of external nose and ears  NECK: Neck is soft and supple without masses, no adenopathy or thyromegaly, trachea midline, no JVD. Normal range of motion.   LUNGS: clear to auscultation bilaterally, no wheezes, rales or rhonchi, good air movement  CV: Regular rate and rhythm, normal S1/S2, no audible murmurs, gallops, or rubs. No carotid bruit and no peripheral edema.   MS: moves all extremities without noticeable abnormality. No edema noted  Abd: soft/nontender/nondistended/normal bowel sounds. Obese    Skin: warm and dry, no rash   Extremities:  No clubbing, cyanosis, or edema. Capillary refill is WNL. Pulses intact bilaterally in upper and lower extremities.   Neuro: CN II-XII intact, sensation and reflexes normal throughout, 5/5 muscle strength in bilateral upper and lower extremities. Normal finger to nose. Normal rapid alternating movements.   PSYCH: pleasant and cooperative, no obvious depression or anxiety  ASSESSMENT AND PLAN:  1. Encounter to establish care - Follow up in one year for CPE - Follow up sooner if needed - Continue to work on diet and exercise.  - Reviewed notes from GYN. Pertinent information added to chart. Labs WNL at GYN visit.   2. Essential hypertension - amLODipine (NORVASC) 10 MG tablet; Take 1 tablet (10 mg total) by mouth daily.  Dispense: 30 tablet; Refill: 0 - hydrochlorothiazide (MICROZIDE) 12.5 MG capsule; Take 1 capsule (12.5 mg total) by mouth once.  Dispense: 30 capsule; Refill: 3 - Basic metabolic panel  3. Obesity - phentermine 15 MG capsule; Take 1 capsule (15 mg total) by mouth every morning.  Dispense: 30 capsule; Refill: 0 - Follow up in three months for recheck   -We reviewed the PMH, PSH, FH, SH, Meds and Allergies. -We provided refills for any medications we will prescribe as needed. -We addressed current concerns per orders and patient instructions. -We have asked for records for pertinent exams, studies, vaccines and notes from previous providers. -We have advised patient to follow up per instructions below.   -Patient advised to return or notify a provider immediately if symptoms worsen or persist or new concerns arise.    Shirline Frees, AGNP

## 2015-01-23 NOTE — Addendum Note (Signed)
Addended by: Nancy Fetter on: 01/23/2015 12:56 PM   Modules accepted: SmartSet

## 2015-01-23 NOTE — Progress Notes (Signed)
Pre visit review using our clinic review tool, if applicable. No additional management support is needed unless otherwise documented below in the visit note. 

## 2015-01-30 ENCOUNTER — Encounter: Payer: Self-pay | Admitting: Adult Health

## 2015-02-02 ENCOUNTER — Other Ambulatory Visit: Payer: Self-pay | Admitting: Adult Health

## 2015-02-02 MED ORDER — LISINOPRIL 10 MG PO TABS: 10.0000 mg | ORAL_TABLET | Freq: Every day | ORAL | Status: DC

## 2015-02-19 ENCOUNTER — Encounter: Payer: Self-pay | Admitting: Adult Health

## 2015-02-19 ENCOUNTER — Other Ambulatory Visit: Payer: Self-pay | Admitting: Adult Health

## 2015-02-19 MED ORDER — PHENTERMINE HCL 30 MG PO CAPS: 30.0000 mg | ORAL_CAPSULE | ORAL | Status: DC

## 2015-02-20 ENCOUNTER — Other Ambulatory Visit: Payer: Self-pay | Admitting: Adult Health

## 2015-02-20 NOTE — Telephone Encounter (Signed)
Rx printed and fax[facility] to [facility].

## 2015-03-13 ENCOUNTER — Other Ambulatory Visit: Payer: Self-pay | Admitting: Adult Health

## 2015-03-23 ENCOUNTER — Other Ambulatory Visit: Payer: Self-pay | Admitting: Adult Health

## 2015-03-23 MED ORDER — PHENTERMINE HCL 30 MG PO CAPS: 30.0000 mg | ORAL_CAPSULE | ORAL | Status: DC

## 2015-03-23 NOTE — Addendum Note (Signed)
Addended by: Azucena FreedMILLNER, Lorrine Killilea C on: 03/23/2015 04:23 PM   Modules accepted: Orders

## 2015-04-28 ENCOUNTER — Other Ambulatory Visit: Payer: Self-pay | Admitting: Adult Health

## 2015-04-28 ENCOUNTER — Encounter: Payer: Self-pay | Admitting: Adult Health

## 2015-04-28 DIAGNOSIS — I10 Essential (primary) hypertension: Secondary | ICD-10-CM

## 2015-04-28 MED ORDER — HYDROCHLOROTHIAZIDE 12.5 MG PO CAPS: 12.5000 mg | ORAL_CAPSULE | Freq: Once | ORAL | Status: DC

## 2015-04-28 MED ORDER — AMLODIPINE BESYLATE 10 MG PO TABS: 10.0000 mg | ORAL_TABLET | Freq: Every day | ORAL | Status: DC

## 2015-04-28 MED ORDER — LISINOPRIL 10 MG PO TABS: 10.0000 mg | ORAL_TABLET | Freq: Every day | ORAL | Status: DC

## 2015-04-28 NOTE — Telephone Encounter (Signed)
Call in #[phone] with no rf  

## 2015-04-28 NOTE — Telephone Encounter (Signed)
[facility]

## 2015-04-30 MED ORDER — PHENTERMINE HCL 30 MG PO CAPS: 30.0000 mg | ORAL_CAPSULE | ORAL | Status: DC

## 2015-06-08 ENCOUNTER — Ambulatory Visit (INDEPENDENT_AMBULATORY_CARE_PROVIDER_SITE_OTHER): Payer: BLUE CROSS/BLUE SHIELD | Admitting: Adult Health

## 2015-06-08 ENCOUNTER — Encounter: Payer: Self-pay | Admitting: Adult Health

## 2015-06-08 VITALS — BP 122/82 | Temp 98.5°F | Ht 67.32 in | Wt 284.0 lb

## 2015-06-08 DIAGNOSIS — R1011 Right upper quadrant pain: Secondary | ICD-10-CM | POA: Diagnosis not present

## 2015-06-08 NOTE — Progress Notes (Signed)
Pre visit review using our clinic review tool, if applicable. No additional management support is needed unless otherwise documented below in the visit note. 

## 2015-06-08 NOTE — Progress Notes (Signed)
Subjective:    Patient ID: [name], female    DOB: [birth year], 35 y.o.   MRN: [id number]  HPI   35 year old pleasant female who is well known to me, presents to the office today with the complaint of abdominal pain in upper right and middle quadrants. The pain is described as intense, stabbing, throbbing pain with associated nausea. The pain happens after eating greasy food and lasts approx 2 hours with intense pain for about 40 minutes. This has happened three times over the last few weeks.   She does have a family history of cholecystitis.   Denies any vomiting, diarrhea, or waking up with a sour taste in her mouth.   Her blood pressure is well controlled during this visit and she has lost 8 pounds since September. She had to take a one month hiatus from phentermine due to changing jobs.     Review of Systems  Constitutional: Negative.   Respiratory: Negative.   Cardiovascular: Negative.   Gastrointestinal: Positive for nausea and abdominal pain. Negative for vomiting, diarrhea, constipation, blood in stool and rectal pain.  Neurological: Negative.    Past Medical History  Diagnosis Date  . Hypertension   . Depression   . Genital herpes   . Polycystic ovaries   . Dysplasia of cervix   . Obesity     Social History   Social History  . Marital Status: Married    Spouse Name: N/A  . Number of Children: N/A  . Years of Education: N/A   Occupational History  . Rehab [employer]   Social History Main Topics  . Smoking status: Never Smoker   . Smokeless tobacco: Not on file  . Alcohol Use: No  . Drug Use: No  . Sexual Activity: Yes   Other Topics Concern  . Not on file   Social History Narrative      Married for 13 years, 2 children   She likes to ride horses    Past Surgical History  Procedure Laterality Date  . Cesarean section  [id number]  . Tubal ligation  2010  . Colposcopy  2014    Family History  Problem Relation Age of Onset  . Thyroid  disease Mother 8  . Thyroid disease Brother 40  . Breast cancer Paternal Grandmother 37    Breast  . Breast cancer Other   . Uterine cancer Maternal Grandmother   . Thyroid disease Maternal Grandmother 25  . Ovarian cancer Maternal Grandmother   . Hypertension Maternal Grandmother     Allergies  Allergen Reactions  . Penicillins Rash    Current Outpatient Prescriptions on File Prior to Visit  Medication Sig Dispense Refill  . amLODipine (NORVASC) 10 MG tablet Take 1 tablet (10 mg total) by mouth daily. 30 tablet 5  . escitalopram (LEXAPRO) 20 MG tablet Take 20 mg by mouth at bedtime.     . hydrochlorothiazide (MICROZIDE) 12.5 MG capsule Take 1 capsule (12.5 mg total) by mouth once. 30 capsule 5  . lisinopril (PRINIVIL,ZESTRIL) 10 MG tablet Take 1 tablet (10 mg total) by mouth daily. 30 tablet 5  . metFORMIN (GLUCOPHAGE) 1000 MG tablet Take 1,000 mg by mouth 2 (two) times daily with a meal.    . phentermine 30 MG capsule Take 1 capsule (30 mg total) by mouth every morning. (Patient not taking: Reported on 06/08/2015) 30 capsule 0   No current facility-administered medications on file prior to visit.    BP 122/82 mmHg  Temp(Src) 98.5 F (36.9 C) (Oral)  Ht 5' 7.32" (1.71 m)  Wt 284 lb (128.822 kg)  BMI 44.06 kg/m2  LMP 03/26/2015       Objective:   Physical Exam  Constitutional: She is oriented to person, place, and time. She appears well-developed and well-nourished. No distress.  Cardiovascular: Normal rate, regular rhythm, normal heart sounds and intact distal pulses.  Exam reveals no gallop.   No murmur heard. Pulmonary/Chest: Effort normal and breath sounds normal. No respiratory distress. She has no wheezes. She has no rales. She exhibits no tenderness.  Abdominal: Soft. Bowel sounds are normal. She exhibits no distension and no mass. There is tenderness in the right upper quadrant, epigastric area and periumbilical area. There is no rigidity, no rebound, no guarding,  no CVA tenderness and negative Murphy's sign.  Neurological: She is alert and oriented to person, place, and time. She has normal reflexes.  Skin: Skin is warm and dry. No rash noted. She is not diaphoretic. No erythema. No pallor.  Psychiatric: She has a normal mood and affect. Her behavior is normal. Thought content normal.  Nursing note and vitals reviewed.     Assessment & Plan:  1. Right upper quadrant pain - Likely cholecystitis.  - US Abdomen Complete; Future - CMP - CBC with Differential/Platelet - Can try OTC prilosec or nexium.  - Consider referral to General Surgery or GI

## 2015-06-08 NOTE — Patient Instructions (Addendum)
It was great seeing you again.   Someone will call you to schedule your ultrasound. Depending on what the shows we will get you over to general surgery.   Stay away from greasy foods the best that you can  Start using OTC Prilosec or Nexium  Cholecystitis Cholecystitis is inflammation of the gallbladder. It is often called a gallbladder attack. The gallbladder is a pear-shaped organ that lies beneath the liver on the right side of the body. The gallbladder stores bile, which is a fluid that helps the body to digest fats. If bile builds up in your gallbladder, your gallbladder becomes inflamed. This condition may occur suddenly (be acute). Repeat episodes of acute cholecystitis or prolonged episodes may lead to a long-term (chronic) condition. Cholecystitis is serious and it requires treatment.  CAUSES The most common cause of this condition is gallstones. Gallstones can block the tube (duct) that carries bile out of your gallbladder. This causes bile to build up. Other causes of this condition include:  Damage to the gallbladder due to a decrease in blood flow.  Infections in the bile ducts.  Scars or kinks in the bile ducts.  Tumors in the liver, pancreas, or gallbladder. RISK FACTORS This condition is more likely to develop in:  People who have sickle cell disease.  People who take birth control pills or use estrogen.  People who have alcoholic liver disease.  People who have liver cirrhosis.  People who have their nutrition delivered through a vein (parenteral nutrition).  People who do not eat or drink (do fasting) for a long period of time.  People who are obese.  People who have rapid weight loss.  People who are pregnant.  People who have increased triglyceride levels.  People who have pancreatitis. SYMPTOMS Symptoms of this condition include:  Abdominal pain, especially in the upper right area of the abdomen.  Abdominal tenderness or  bloating.  Nausea.  Vomiting.  Fever.  Chills.  Yellowing of the skin and the whites of the eyes (jaundice). DIAGNOSIS This condition is diagnosed with a medical history and physical exam. You may also have other tests, including:  Imaging tests, such as:  An ultrasound of the gallbladder.  A CT scan of the abdomen.  A gallbladder nuclear scan (HIDA scan). This scan allows your health care provider to see the bile moving from your liver to your gallbladder and to your small intestine.  MRI.  Blood tests, such as:  A complete blood count, because the white blood cell count may be higher than normal.  Liver function tests, because some levels may be higher than normal with certain types of gallstones. TREATMENT Treatment may include:  Fasting for a certain amount of time.  IV fluids.  Medicine to treat pain or vomiting.  Antibiotic medicine.  Surgery to remove your gallbladder (cholecystectomy). This may happen immediately or at a later time. HOME CARE INSTRUCTIONS Home care will depend on your treatment. In general:  Take over-the-counter and prescription medicines only as told by your health care provider.  If you were prescribed an antibiotic medicine, take it as told by your health care provider. Do not stop taking the antibiotic even if you start to feel better.  Follow instructions from your health care provider about what to eat or drink. When you are allowed to eat, avoid eating or drinking anything that triggers your symptoms.  Keep all follow-up visits as told by your health care provider. This is important. SEEK MEDICAL CARE IF:  Your pain is not controlled with medicine.  You have a fever. SEEK IMMEDIATE MEDICAL CARE IF:  Your pain moves to another part of your abdomen or to your back.  You continue to have symptoms or you develop new symptoms even with treatment.   This information is not intended to replace advice given to you by your health  care provider. Make sure you discuss any questions you have with your health care provider.   Document Released: 04/11/2005 Document Revised: 12/31/2014 Document Reviewed: 07/23/2014 Elsevier Interactive Patient Education [employer]

## 2015-06-09 LAB — COMPREHENSIVE METABOLIC PANEL
ALT: 34 U/L (ref 0–35)
AST: 25 U/L (ref 0–37)
Albumin: 4.4 g/dL (ref 3.5–5.2)
Alkaline Phosphatase: 85 U/L (ref 39–117)
BUN: 12 mg/dL (ref 6–23)
CHLORIDE: 99 meq/L (ref 96–112)
CO2: 32 mEq/L (ref 19–32)
Calcium: 9.6 mg/dL (ref 8.4–10.5)
Creatinine, Ser: 0.76 mg/dL (ref 0.40–1.20)
GFR: 92.92 mL/min (ref >60.00)
GLUCOSE: 95 mg/dL (ref 70–99)
POTASSIUM: 4.4 meq/L (ref 3.5–5.1)
SODIUM: 138 meq/L (ref 135–145)
TOTAL PROTEIN: 7.1 g/dL (ref 6.0–8.3)
Total Bilirubin: 0.4 mg/dL (ref 0.2–1.2)

## 2015-06-09 LAB — CBC WITH DIFFERENTIAL/PLATELET
BASOS PCT: 0.4 % (ref 0.0–3.0)
Basophils Absolute: 0 10*3/uL (ref 0.0–0.1)
EOS PCT: 1.2 % (ref 0.0–5.0)
Eosinophils Absolute: 0.1 10*3/uL (ref 0.0–0.7)
HCT: 36.6 % (ref 36.0–46.0)
HEMOGLOBIN: 12.3 g/dL (ref 12.0–15.0)
LYMPHS ABS: 1.9 10*3/uL (ref 0.7–4.0)
Lymphocytes Relative: 26.5 % (ref 12.0–46.0)
MCHC: 33.6 g/dL (ref 30.0–36.0)
MCV: 90.7 fl (ref 78.0–100.0)
Monocytes Absolute: 0.5 10*3/uL (ref 0.1–1.0)
Monocytes Relative: 7.7 % (ref 3.0–12.0)
Neutro Abs: 4.5 10*3/uL (ref 1.4–7.7)
Neutrophils Relative %: 64.2 % (ref 43.0–77.0)
Platelets: 255 10*3/uL (ref 150.0–400.0)
RBC: 4.04 Mil/uL (ref 3.87–5.11)
RDW: 13.7 % (ref 11.5–15.5)
WBC: 7.1 10*3/uL (ref 4.0–10.5)

## 2015-06-17 ENCOUNTER — Ambulatory Visit
Admission: RE | Admit: 2015-06-17 | Discharge: 2015-06-17 | Disposition: A | Discharge status: Home / Self Care | Payer: BLUE CROSS/BLUE SHIELD | Source: Ambulatory Visit | Attending: Adult Health | Admitting: Adult Health

## 2015-06-17 ENCOUNTER — Encounter: Payer: Self-pay | Admitting: Adult Health

## 2015-06-17 DIAGNOSIS — R1011 Right upper quadrant pain: Secondary | ICD-10-CM

## 2015-06-18 ENCOUNTER — Other Ambulatory Visit: Payer: Self-pay | Admitting: Adult Health

## 2015-06-18 DIAGNOSIS — K8020 Calculus of gallbladder without cholecystitis without obstruction: Secondary | ICD-10-CM

## 2015-06-18 MED ORDER — TRAMADOL HCL 50 MG PO TABS: 50.0000 mg | ORAL_TABLET | Freq: Three times a day (TID) | ORAL | Status: DC | PRN

## 2015-06-18 NOTE — Telephone Encounter (Signed)
Updated patient on results. She would like to go ahead and see gen surg. Will place this consult.

## 2015-07-20 ENCOUNTER — Other Ambulatory Visit: Payer: Self-pay | Admitting: Family Medicine

## 2015-07-21 NOTE — Telephone Encounter (Signed)
Left message for patient to return phone call - need to obtain current weight from patient [facility]

## 2015-07-21 NOTE — Telephone Encounter (Signed)
Patient's states she weighed yesterday and her current weight is 276 - ok to refill?

## 2015-07-22 NOTE — Telephone Encounter (Signed)
OK to refill for one month 

## 2015-10-29 ENCOUNTER — Other Ambulatory Visit: Payer: Self-pay | Admitting: Adult Health

## 2015-10-30 MED ORDER — PHENTERMINE HCL 37.5 MG PO CAPS: 37.5000 mg | ORAL_CAPSULE | ORAL | Status: DC

## 2015-10-31 ENCOUNTER — Telehealth: Payer: Self-pay | Admitting: Adult Health

## 2015-11-02 MED ORDER — PHENTERMINE HCL 37.5 MG PO CAPS: 37.5000 mg | ORAL_CAPSULE | ORAL | Status: DC

## 2015-11-02 NOTE — Telephone Encounter (Signed)
Will you please send in for pt?  phentermine 37.5 MG capsule  [facility]  [facility]

## 2015-11-02 NOTE — Telephone Encounter (Signed)
Rx called in to pharmacy. 

## 2015-12-02 ENCOUNTER — Other Ambulatory Visit: Payer: Self-pay | Admitting: Adult Health

## 2015-12-03 ENCOUNTER — Other Ambulatory Visit: Payer: Self-pay | Admitting: Adult Health

## 2015-12-03 ENCOUNTER — Encounter: Payer: Self-pay | Admitting: Adult Health

## 2015-12-03 MED ORDER — PHENTERMINE HCL 37.5 MG PO CAPS: 37.5000 mg | ORAL_CAPSULE | ORAL | 0 refills | Status: DC

## 2015-12-07 MED ORDER — PHENTERMINE HCL 37.5 MG PO CAPS
37.5000 mg | ORAL_CAPSULE | ORAL | 0 refills | Status: DC
Start: 2015-12-07 — End: 2016-01-05

## 2015-12-07 NOTE — Telephone Encounter (Signed)
Rx called in 

## 2015-12-07 NOTE — Telephone Encounter (Signed)
Pt is calling to let [facility]ory know the pharm did not received phentermine 37.5 mg [facility]ill [facility]

## 2015-12-17 ENCOUNTER — Ambulatory Visit (INDEPENDENT_AMBULATORY_CARE_PROVIDER_SITE_OTHER): Payer: BLUE CROSS/BLUE SHIELD | Admitting: Adult Health

## 2015-12-17 ENCOUNTER — Encounter: Payer: Self-pay | Admitting: Adult Health

## 2015-12-17 ENCOUNTER — Telehealth: Payer: Self-pay | Admitting: Adult Health

## 2015-12-17 VITALS — BP 124/70 | Temp 98.1°F | Wt 257.0 lb

## 2015-12-17 DIAGNOSIS — B084 Enteroviral vesicular stomatitis with exanthem: Secondary | ICD-10-CM

## 2015-12-17 MED ORDER — LIDOCAINE VISCOUS 2 % MT SOLN: 10.0000 mL | OROMUCOSAL | 0 refills | Status: DC | PRN

## 2015-12-17 NOTE — Progress Notes (Signed)
Subjective:    Patient ID: Ihor GullyAthena Ramsey Schuur, female    DOB: 05-Jul-1980, 35 y.o.   MRN: [id number]  HPI [age] year old female who presents to the office today for the complaint of blisters in her mouth for the last 5 days and have been getting worse over the week She also reports that she has a sore throat but does not have any trouble swallowing and does not have any issues with . She saw a NP at her work who diagnosed this as Hand Foot and Mouth disease. She was prescribed magic mouth wash. She reports that this helps but the discomfort comes back.   She denies any fevers or feeling acutely ill.     Review of Systems  Constitutional: Negative.   HENT: Positive for mouth sores and sore throat. Negative for postnasal drip, rhinorrhea, sinus pressure and trouble swallowing.   Respiratory: Negative.   Cardiovascular: Negative.   Allergic/Immunologic: Negative.   Neurological: Negative.   All other systems reviewed and are negative.  Past Medical History:  Diagnosis Date  . Depression   . Dysplasia of cervix   . Genital herpes   . Hypertension   . Obesity   . Polycystic ovaries     Social History   Social History  . Marital status: Married    Spouse name: N/A  . Number of children: N/A  . 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 Smoking status: Never Smoker  . Smokeless tobacco: Not on file  . Alcohol use No  . Drug use: No  . Sexual activity: Yes   Other Topics Concern  . Not on file   Social History Narrative      Married for 13 years, 2 children   She likes to ride horses    Past Surgical History:  Procedure Laterality Date  . CESAREAN SECTION  [id number]  . COLPOSCOPY  2014  . TUBAL LIGATION  2010    Family History  Problem Relation Age of Onset  . Uterine cancer Maternal Grandmother   . Thyroid disease Maternal Grandmother 25  . Ovarian cancer Maternal Grandmother   . Hypertension Maternal  Grandmother   . Breast cancer Paternal Grandmother 1751    Breast  . Thyroid disease Mother 2730  . Thyroid disease Brother 6524  . Breast cancer Other     Allergies  Allergen Reactions  . Penicillins Rash    Current Outpatient Prescriptions on File Prior to Visit  Medication Sig Dispense Refill  . amLODipine (NORVASC) 10 MG tablet Take 1 tablet (10 mg total) by mouth daily. 30 tablet 5  . escitalopram (LEXAPRO) 20 MG tablet Take 20 mg by mouth at bedtime.     . hydrochlorothiazide (MICROZIDE) 12.5 MG capsule Take 1 capsule (12.5 mg total) by mouth once. 30 capsule 5  . lisinopril (PRINIVIL,ZESTRIL) 10 MG tablet Take 1 tablet (10 mg total) by mouth daily. 30 tablet 5  . metFORMIN (GLUCOPHAGE) 1000 MG tablet Take 1,000 mg by mouth 2 (two) times daily with a meal.    . phentermine 37.5 MG capsule Take 1 capsule (37.5 mg total) by mouth every morning. 30 capsule 0  . traMADol (ULTRAM) 50 MG tablet Take 1 tablet (50 mg total) by mouth every 8 (eight) hours as needed. 30 tablet 0   No current facility-administered medications on file prior to visit.     BP 124/70   Temp 98.1  F (36.7 C) (Oral)   Wt 257 lb (116.6 kg)   BMI 39.87 kg/m       Objective:   Physical Exam  Constitutional: She is oriented to person, place, and time. She appears well-developed and well-nourished. No distress.  HENT:  Head: Normocephalic and atraumatic.  Right Ear: External ear normal.  Left Ear: External ear normal.  Nose: Nose normal.  Mouth/Throat: Uvula is midline and mucous membranes are normal. No oropharyngeal exudate.  Small white and clear oral lesions noted on tongue, hard palate and buccal mucosa .   Eyes: Conjunctivae and EOM are normal. Pupils are equal, round, and reactive to light. Right eye exhibits no discharge. Left eye exhibits no discharge. No scleral icterus.  Dryness noted on bilateral upper eye lids  Cardiovascular: Normal rate, regular rhythm, normal heart sounds and intact distal  pulses.  Exam reveals no gallop and no friction rub.   No murmur heard. Pulmonary/Chest: Effort normal and breath sounds normal. No respiratory distress. She has no wheezes. She has no rales. She exhibits no tenderness.  Musculoskeletal: Normal range of motion. She exhibits no edema, tenderness or deformity.  Neurological: She is alert and oriented to person, place, and time.  Skin: Skin is warm and dry. No rash noted. She is not diaphoretic. No erythema. No pallor.  Psychiatric: She has a normal mood and affect. Her behavior is normal. Judgment and thought content normal.  Nursing note and vitals reviewed.     Assessment & Plan:  1. Hand, foot and mouth disease - Exam is consistent with hand, foot, and mouth disease. Advised that this virus should resolve in 10 days.  - lidocaine (XYLOCAINE) 2 % solution; Use as directed 10 mLs in the mouth or throat every 4 (four) hours as needed for mouth pain.  Dispense: 100 mL; Refill: 0 - NSAIDS or Tylenol  - Follow up if no improvement  Shirline Freesory Coriana Angello, NP

## 2015-12-17 NOTE — Telephone Encounter (Signed)
Pending appt at 11:15 today.

## 2015-12-17 NOTE — Telephone Encounter (Signed)
Patient Name: [name]  DOB: [birth year]    Initial Comment Caller states she has blisters in mouth, back of throat, tongue.   Nurse Assessment  Nurse: Stefano GaulStringer, RN, Dwana CurdVera Date/Time (Eastern Time): 12/17/2015 9:05:09 AM  Confirm and document reason for call. If symptomatic, describe symptoms. You must click the next button to save text entered. ---Caller states she has blisters on the back of her throat, in the roof of her mouth, and on her tongue. had sore throat on Saturday. Has a cluster of blisters on her tongue and on her uvula. Went to the clinic at her job. no blisters anywhere else. no fever. Still has sore throat. She got magic mouthwash yesterday.  Has the patient traveled out of the country within the last 30 days? ---Not Applicable  Does the patient have any new or worsening symptoms? ---Yes  Will a triage be completed? ---Yes  Related visit to physician within the last 2 weeks? ---Yes  Does the PT have any chronic conditions? (i.e. diabetes, asthma, etc.) ---No  Is the patient pregnant or possibly pregnant? (Ask all females between the ages of 4112-55) ---No  Is this a behavioral health or substance abuse call? ---No     Guidelines    Guideline Title Affirmed Question Affirmed Notes  Mouth Ulcers Large lymph node (> 1 inch or 2.5 cm) under the jaw    Final Disposition User   See Physician within 24 Hours New BeaverStringer, RN, Vera    Comments  appt scheduled for 11:15 am 12/17/15 with Shirline Freesory nafziger   Referrals  REFERRED TO PCP OFFICE   Disagree/Comply: Danella Maiersomply

## 2015-12-19 ENCOUNTER — Emergency Department (HOSPITAL_COMMUNITY)
Admission: EM | Admit: 2015-12-19 | Discharge: 2015-12-19 | Disposition: A | Discharge status: Home / Self Care | Payer: BLUE CROSS/BLUE SHIELD | Attending: Emergency Medicine | Admitting: Emergency Medicine

## 2015-12-19 ENCOUNTER — Emergency Department (HOSPITAL_COMMUNITY): Payer: BLUE CROSS/BLUE SHIELD

## 2015-12-19 ENCOUNTER — Encounter (HOSPITAL_COMMUNITY): Payer: Self-pay | Admitting: Emergency Medicine

## 2015-12-19 DIAGNOSIS — R51 Headache: Secondary | ICD-10-CM | POA: Insufficient documentation

## 2015-12-19 DIAGNOSIS — R1310 Dysphagia, unspecified: Secondary | ICD-10-CM | POA: Diagnosis not present

## 2015-12-19 DIAGNOSIS — I10 Essential (primary) hypertension: Secondary | ICD-10-CM | POA: Diagnosis not present

## 2015-12-19 DIAGNOSIS — J029 Acute pharyngitis, unspecified: Secondary | ICD-10-CM | POA: Diagnosis not present

## 2015-12-19 DIAGNOSIS — Z7984 Long term (current) use of oral hypoglycemic drugs: Secondary | ICD-10-CM | POA: Diagnosis not present

## 2015-12-19 DIAGNOSIS — Z79899 Other long term (current) drug therapy: Secondary | ICD-10-CM | POA: Diagnosis not present

## 2015-12-19 DIAGNOSIS — R519 Headache, unspecified: Secondary | ICD-10-CM

## 2015-12-19 LAB — CBC
HEMATOCRIT: 38.7 % (ref 36.0–46.0)
Hemoglobin: 12.3 g/dL (ref 12.0–15.0)
MCH: 28.5 pg (ref 26.0–34.0)
MCHC: 31.8 g/dL (ref 30.0–36.0)
MCV: 89.8 fL (ref 78.0–100.0)
PLATELETS: 209 10*3/uL (ref 150–400)
RBC: 4.31 MIL/uL (ref 3.87–5.11)
RDW: 12.9 % (ref 11.5–15.5)
WBC: 9.1 10*3/uL (ref 4.0–10.5)

## 2015-12-19 MED ORDER — OXYMETAZOLINE HCL 0.05 % NA SOLN
1.0000 | Freq: Once | NASAL | Status: AC
  Administered 2015-12-19: 1 via NASAL
  Filled 2015-12-19: qty 15

## 2015-12-19 MED ORDER — LIDOCAINE-EPINEPHRINE-TETRACAINE (LET) SOLUTION
3.0000 mL | Freq: Once | NASAL | Status: DC
  Filled 2015-12-19: qty 3

## 2015-12-19 MED ORDER — LIDOCAINE-EPINEPHRINE (PF) 2 %-1:200000 IJ SOLN
10.0000 mL | Freq: Once | INTRAMUSCULAR | Status: AC
  Administered 2015-12-19: 10 mL
  Filled 2015-12-19: qty 20

## 2015-12-19 MED ORDER — IOPAMIDOL (ISOVUE-300) INJECTION 61%
INTRAVENOUS | Status: AC
  Administered 2015-12-19: 75 mL
  Filled 2015-12-19: qty 75

## 2015-12-19 NOTE — ED Triage Notes (Signed)
Pt. Stated, I was treated a week ago and treated for foot and hand, and this morning I started having my face to swell and I have sores in my mouth

## 2015-12-19 NOTE — Discharge Instructions (Signed)
There is no evidence of any dangerous infection or abscess in your throat, or any ongoing allergic reaction.  Your sore throat is likely still due to the viral illness you had.  Please follow up with your primary care provider if symptoms do not improve over the weekend.  If you develop a fever, trouble breathing, swelling of the lips, tongue, or throat, drooling, or difficulty swallowing, please return to the ED.

## 2015-12-19 NOTE — ED Provider Notes (Signed)
[facility]-EMERGENCY DEPT Provider Note   CSN: [id number] Arrival date & time: 12/19/15  [id number]     History   Chief Complaint Chief Complaint  Patient presents with  . Facial Swelling  . Allergic Reaction    HPI [name] is a 35 y.o. female with HTN and PCOS complaining of throat swelling and dysphagia.  For the past week, she has had blisters/ulcers on her tongue and mouth and sore throat.  She was seen by PCP and diagnoses with hand, foot, and mouth disease.   Given Magic Mouthwash several days ago, then lidocaine solution the days before yesterday for symptomatic treatment.  Today, she felt lip and and throat swelling,worse sore throat, and difficulty breathing.  Bactrim several weeks ago for UTI, last dose at least a week prior to current symptoms.  HPI  Past Medical History:  Diagnosis Date  . Depression   . Dysplasia of cervix   . Genital herpes   . Hypertension   . Obesity   . Polycystic ovaries     There are no active problems to display for this patient.   Past Surgical History:  Procedure Laterality Date  . CESAREAN SECTION  [id number]  . COLPOSCOPY  2014  . TUBAL LIGATION  2010    OB History    No data available       Home Medications    Prior to Admission medications   Medication Sig Start Date End Date Taking? Authorizing Provider  amLODipine (NORVASC) 10 MG tablet Take 1 tablet (10 mg total) by mouth daily. 04/28/15   Shirline Freesory Nafziger, NP  escitalopram (LEXAPRO) 20 MG tablet Take 20 mg by mouth at bedtime.     Historical Provider, MD  hydrochlorothiazide (MICROZIDE) 12.5 MG capsule Take 1 capsule (12.5 mg total) by mouth once. 04/28/15   Shirline Freesory Nafziger, NP  lidocaine (XYLOCAINE) 2 % solution Use as directed 10 mLs in the mouth or throat every 4 (four) hours as needed for mouth pain. 12/17/15   Shirline Freesory Nafziger, NP  lisinopril (PRINIVIL,ZESTRIL) 10 MG tablet Take 1 tablet (10 mg total) by mouth daily. 04/28/15   Shirline Freesory Nafziger, NP  metFORMIN (GLUCOPHAGE)  1000 MG tablet Take 1,000 mg by mouth 2 (two) times daily with a meal.    Historical Provider, MD  phentermine 37.5 MG capsule Take 1 capsule (37.5 mg total) by mouth every morning. 12/07/15   Shirline Freesory Nafziger, NP  traMADol (ULTRAM) 50 MG tablet Take 1 tablet (50 mg total) by mouth every 8 (eight) hours as needed. 06/18/15   Shirline Freesory Nafziger, NP    Family History Family History  Problem Relation Age of Onset  . Uterine cancer Maternal Grandmother   . Thyroid disease Maternal Grandmother 25  . Ovarian cancer Maternal Grandmother   . Hypertension Maternal Grandmother   . Breast cancer Paternal Grandmother 3151    Breast  . Thyroid disease Mother 4430  . Thyroid disease Brother 2824  . Breast cancer Other     Social History Social History  Substance Use Topics  . Smoking status: Never Smoker  . Smokeless tobacco: Never Used  . Alcohol use No     Allergies   Penicillins   Review of Systems Review of Systems  HENT: Positive for facial swelling, mouth sores, rhinorrhea, sore throat and voice change. Negative for drooling and trouble swallowing.   Eyes: Negative for discharge and redness.  Respiratory: Negative for cough, choking, chest tightness and stridor.   Musculoskeletal: Negative for neck pain and neck stiffness.  Skin: Negative for rash.  Neurological: Positive for headaches. Negative for weakness.     Physical Exam Updated Vital Signs BP 127/81 (BP Location: Right Arm)   Pulse 82   Temp 98.2 F (36.8 C) (Oral)   Resp 20   Ht 5\' 6"  (1.676 m)   Wt 116.6 kg   LMP 09/18/2015   SpO2 97%   BMI 41.48 kg/m   Physical Exam  Constitutional: She is oriented to person, place, and time. She appears well-developed and well-nourished. No distress.  HENT:  Oropharynx and posterior tongue with multiple small, healing blisters/pustules  Eyes: Conjunctivae are normal. Right eye exhibits no discharge. Left eye exhibits no discharge. No scleral icterus.  Bilateral eyelid dermatitis    Neck: Normal range of motion. Neck supple.  Cardiovascular: Normal rate and regular rhythm.   Pulmonary/Chest: Effort normal and breath sounds normal.  Lymphadenopathy:    She has no cervical adenopathy.  Neurological: She is alert and oriented to person, place, and time.  Skin: Skin is warm and dry. No rash noted.  Psychiatric: She has a normal mood and affect. Her behavior is normal.     ED Treatments / Results  Labs (all labs ordered are listed, but only abnormal results are displayed) Labs Reviewed  CBC    EKG  EKG Interpretation None       Radiology Ct Head Wo Contrast  Result Date: 12/19/2015 CLINICAL DATA:  Left-sided headache. EXAM: CT HEAD WITHOUT CONTRAST TECHNIQUE: Contiguous axial images were obtained from the base of the skull through the vertex without intravenous contrast. COMPARISON:  01/16/2015 FINDINGS: Brain: No acute cortical infarct, hemorrhage, or mass lesion ispresent. Ventricles are of normal size. No significant extra-axial fluid collection is present. Vascular: Unremarkable Skull: The osseous skull is intact. Sinuses/Orbits: The paranasal sinuses andmastoid air cells are clear. Other: None IMPRESSION: 1. Normal brain. Electronically Signed   By: Signa Kell M.D.   On: 12/19/2015 12:48   Ct Soft Tissue Neck W Contrast  Result Date: 12/19/2015 CLINICAL DATA:  Sore throat EXAM: CT NECK WITH CONTRAST TECHNIQUE: Multidetector CT imaging of the neck was performed using the standard protocol following the bolus administration of intravenous contrast. CONTRAST:  75mL ISOVUE-300 IOPAMIDOL (ISOVUE-300) INJECTION 61% COMPARISON:  None. FINDINGS: Pharynx and larynx: Mild hypertrophy of the tonsils and adenoid tissue without mass or abscess. Epiglottis and vocal cords normal. Salivary glands: Parotid and submandibular glands normal bilaterally Thyroid: Normal Lymph nodes: Right level 2 node 12.4 mm. Right lower level 2 node 10 mm. Subcentimeter posterior lymph nodes  on the right. Left level 2 node 12 mm.  Subcentimeter posterior nodes on the left. Vascular: Carotid artery and jugular vein patent bilaterally. Limited intracranial: Negative Visualized orbits: Negative Mastoids and visualized paranasal sinuses: Mild mucosal edema left maxillary sinus and right mastoid sinus. Remaining sinuses clear. Skeleton: Negative Upper chest: Lung apices clear IMPRESSION: Hypertrophy of the adenoid and tonsils which may be due to pharyngitis. No abscess or mass. Mild cervical adenopathy most likely reactive due to pharyngitis. Electronically Signed   By: Marlan Palau M.D.   On: 12/19/2015 12:54    Procedures Procedures (including critical care time)  Medications Ordered in ED Medications  oxymetazoline (AFRIN) 0.05 % nasal spray 1 spray (not administered)  lidocaine-EPINEPHrine-tetracaine (LET) solution (not administered)     Initial Impression / Assessment and Plan / ED Course  I have reviewed the triage vital signs and the nursing notes.  Pertinent labs & imaging results that were available during my  care of the patient were reviewed by me and considered in my medical decision making (see chart for details).  Persistent sore throat and dysphagia without constitutional symptoms likely represents sequelae of her presumed viral illness.  No evident oropharyngeal edema to suggest anaphylaxis.  Less likely diagnoses include retropharyngeal abscess and epiglottitis. -Laryngoscopy -CT head/neck w/ w/o contrast for possible abscess  Clinical Course  Comment By Time  Laryngoscopy by Dr Rosalia Hammers shows diffuse pharyngeal erythema but no mass or signifcant edema Alm Bustard, MD 08/26 1045  No leukocytosis Alm Bustard, MD 08/26 1120  CT without abscess or mass Alm Bustard, MD 08/26 1309    Final Clinical Impressions(s) / ED Diagnoses   Final diagnoses:  Sore throat   No evidence of retropharyngeal abscess or epiglottitis.  Airway intact.  Pain and  edema likely represents prior resolving viral pharyngitis.  New Prescriptions New Prescriptions   No medications on file     Alm Bustard, MD 12/19/15 1433    Margarita Grizzle, MD 01/01/16 [phone]

## 2015-12-20 ENCOUNTER — Other Ambulatory Visit: Payer: Self-pay | Admitting: Adult Health

## 2015-12-20 DIAGNOSIS — I10 Essential (primary) hypertension: Secondary | ICD-10-CM

## 2015-12-21 ENCOUNTER — Telehealth: Payer: Self-pay | Admitting: Emergency Medicine

## 2015-12-21 NOTE — Telephone Encounter (Signed)
Please Advise:  Pt seen in ED 12/19/15       PLEASE NOTE: All timestamps contained within this report are represented as [place] Standard Time. CONFIDENTIALTY NOTICE: This fax transmission is intended only for the addressee. It contains information that is legally privileged, confidential or otherwise protected from use or disclosure. If you are not the intended recipient, you are strictly prohibited from reviewing, disclosing, copying using or disseminating any of this information or taking any action in reliance on or regarding this information. If you have received this fax in error, please notify us immediately by telephone so that we can arrange for its return to [place]. Phone: [phone], Toll-Free: [phone], Fax: [phone] Page: 1 of 2 Call Id: [id number] [facility] Night - Client TELEPHONE ADVICE RECORD [facility] Medical Call Center Patient Name: [name] Gender: Female DOB: 03/24/1981 Age: 35 Y 11 M 12 D Return Phone Number: [phone] (Primary) Address: City/State/Zip: [place] Client [facility] Night - Client Client [facility] - Night Physician Shirline Frees - NP Contact Type Call Who Is Calling Patient / Member / Family / Caregiver Call Type Triage / Clinical Relationship To Patient Self Return Phone Number [phone] (Primary) Chief Complaint BREATHING - shortness of breath or sounds breathless Reason for Call Symptomatic / Request for Health Information Initial Comment Caller states she was dx with hand, foot, and mouth disease. Now mouth, tongue, and throat are swelling. Feels like she can't breathe. PreDisposition Go to ED Translation No Nurse Assessment Nurse: Stefano Gaul, RN, Dwana Curd Date/Time (Eastern Time): 12/19/2015 7:42:36 AM Confirm and document reason for call. If symptomatic, describe symptoms. You must click the next button to save text entered. ---Caller states she was seen in  the office and was diagnosed with hand foot and mouth disease. Had sores in her mouth and back of her throat. Sores were better yesterday. Has swelling in her lips, tonsils. She can barely swallow. Has sore throat. she can breathe through her nose and is having difficulty breathing. No fever. Has the patient traveled out of the country within the last 30 days? ---Not Applicable Does the patient have any new or worsening symptoms? ---Yes Will a triage be completed? ---Yes Related visit to physician within the last 2 weeks? ---No Does the PT have any chronic conditions? (i.e. diabetes, asthma, etc.) ---No Is the patient pregnant or possibly pregnant? (Ask all females between the ages of 26-55) ---No Is this a behavioral health or substance abuse call? ---No Guidelines Guideline Title Affirmed Question Affirmed Notes Nurse Date/Time (Eastern Time) Sore Throat [1] Difficulty breathing AND [2] not severe Stefano Gaul, RN, Vera 12/19/2015 7:45:34 AM Disp. Time Lamount Cohen Time) Disposition Final User 12/19/2015 7:39:23 AM Send to Urgent Queue Burt Ek PLEASE NOTE: All timestamps contained within this report are represented as [place] Standard Time. CONFIDENTIALTY NOTICE: This fax transmission is intended only for the addressee. It contains information that is legally privileged, confidential or otherwise protected from use or disclosure. If you are not the intended recipient, you are strictly prohibited from reviewing, disclosing, copying using or disseminating any of this information or taking any action in reliance on or regarding this information. If you have received this fax in error, please notify us immediately by telephone so that we can arrange for its return to [place]. Phone: [phone], Toll-Free: [phone], Fax: [phone] Page: 2 of 2 Call Id: [id number] 12/19/2015 7:48:33 AM Go to ED Now Yes Stefano Gaul, RN, Dwana Curd Disposition Overriden: Go to ED Now (or PCP  triage) Override  Reason: Patient's symptoms need a higher level of care Caller Understands: Yes Disagree/Comply: Comply Care Advice Given Per Guideline GO TO ED NOW: You need to be seen in the Emergency Department. Go to the ER at ___________ Hospital. Leave now. Drive carefully. Comments User: Art BuffVera, Stringer, RN Date/Time Lamount Cohen(Eastern Time): 12/19/2015 7:49:18 AM Triage outcome upgraded to go to to ER now as she has swelling in her throat and tongue [facility]

## 2016-01-04 ENCOUNTER — Telehealth: Payer: Self-pay | Admitting: Adult Health

## 2016-01-05 ENCOUNTER — Other Ambulatory Visit: Payer: Self-pay | Admitting: Adult Health

## 2016-01-05 MED ORDER — PHENTERMINE HCL 37.5 MG PO CAPS: 37.5000 mg | ORAL_CAPSULE | ORAL | 0 refills | Status: DC

## 2016-01-07 NOTE — Telephone Encounter (Signed)
Rx has been called in  

## 2016-01-27 ENCOUNTER — Other Ambulatory Visit: Payer: Self-pay | Admitting: Adult Health

## 2016-02-15 ENCOUNTER — Other Ambulatory Visit: Payer: Self-pay | Admitting: Adult Health

## 2016-02-19 ENCOUNTER — Other Ambulatory Visit: Payer: Self-pay | Admitting: Adult Health

## 2016-02-19 ENCOUNTER — Telehealth: Payer: Self-pay | Admitting: Adult Health

## 2016-02-19 NOTE — Telephone Encounter (Signed)
Last OV 12-17-2015 Last refill 01-05-2016 #30  Please advise if okay to fill

## 2016-02-22 NOTE — Telephone Encounter (Signed)
Please get a weight on her.   Thanks  [employer]

## 2016-02-23 NOTE — Telephone Encounter (Signed)
Left message for patient to return phone call.  

## 2016-02-24 ENCOUNTER — Other Ambulatory Visit: Payer: Self-pay

## 2016-02-24 MED ORDER — PHENTERMINE HCL 37.5 MG PO CAPS: 37.5000 mg | ORAL_CAPSULE | ORAL | 3 refills | Status: DC

## 2016-02-25 ENCOUNTER — Other Ambulatory Visit: Payer: Self-pay

## 2016-02-25 DIAGNOSIS — I10 Essential (primary) hypertension: Secondary | ICD-10-CM

## 2016-02-25 MED ORDER — HYDROCHLOROTHIAZIDE 12.5 MG PO CAPS: 12.5000 mg | ORAL_CAPSULE | Freq: Every day | ORAL | 2 refills | Status: DC

## 2016-02-29 ENCOUNTER — Encounter: Payer: Self-pay | Admitting: Family Medicine

## 2016-02-29 ENCOUNTER — Ambulatory Visit (INDEPENDENT_AMBULATORY_CARE_PROVIDER_SITE_OTHER): Payer: BLUE CROSS/BLUE SHIELD | Admitting: Family Medicine

## 2016-02-29 ENCOUNTER — Encounter: Payer: Self-pay | Admitting: Adult Health

## 2016-02-29 VITALS — BP 114/84 | HR 87 | Temp 98.2°F | Wt 264.2 lb

## 2016-02-29 DIAGNOSIS — J320 Chronic maxillary sinusitis: Secondary | ICD-10-CM | POA: Diagnosis not present

## 2016-02-29 MED ORDER — DOXYCYCLINE HYCLATE 100 MG PO TABS: 100.0000 mg | ORAL_TABLET | Freq: Two times a day (BID) | ORAL | 0 refills | Status: DC

## 2016-02-29 NOTE — Progress Notes (Signed)
Subjective:    Patient ID: Kristine GullyAthena Ramsey Alvarado, female    DOB: 08/14/1980, 35 y.o.   MRN: [id number]  HPI  [name] is a 35 year old female who presents today with a sinus pressure/pain for 2 weeks. She reports that symptoms started to improve slightly but are now back and worse today. Associated symptoms of rhinitis with green/yellow drainage, nasal congestion, nonproductive cough, and post nasal drip.  Denies fever, chills sweats, N/V/D, itchy/watery eyes. Influenza vaccine 3 weeks ago. No recent antibiotic use. No recent sick contact exposure. No history of asthma/bronchitis. She is a nonsmoker   Review of Systems  Constitutional: Negative for chills, fatigue and fever.  HENT: Positive for congestion, postnasal drip, rhinorrhea, sinus pain and sinus pressure. Negative for sore throat.   Eyes: Negative for visual disturbance.  Respiratory: Positive for cough. Negative for shortness of breath and wheezing.   Cardiovascular: Negative for chest pain and palpitations.  Gastrointestinal: Negative for abdominal pain, diarrhea, nausea and vomiting.  Musculoskeletal: Negative for myalgias.  Skin: Negative for rash.  Neurological: Negative for dizziness, light-headedness and headaches.   Past Medical History:  Diagnosis Date  . Depression   . Dysplasia of cervix   . Genital herpes   . Hypertension   . Obesity   . Polycystic ovaries      Social History   Social History  . Marital status: Married    Spouse name: N/A  . Number of children: N/A  . 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 Smoking status: Never Smoker  . Smokeless tobacco: Never Used  . Alcohol use No  . Drug use: No  . Sexual activity: Yes   Other Topics Concern  . Not on file   Social History Narrative      Married for 13 years, 2 children   She likes to ride horses    Past Surgical History:  Procedure Laterality Date  . CESAREAN SECTION   [id number]  . COLPOSCOPY  2014  . TUBAL LIGATION  2010    Family History  Problem Relation Age of Onset  . Uterine cancer Maternal Grandmother   . Thyroid disease Maternal Grandmother 25  . Ovarian cancer Maternal Grandmother   . Hypertension Maternal Grandmother   . Breast cancer Paternal Grandmother 3651    Breast  . Thyroid disease Mother 1730  . Thyroid disease Brother 4324  . Breast cancer Other     Allergies  Allergen Reactions  . Penicillins Rash    Current Outpatient Prescriptions on File Prior to Visit  Medication Sig Dispense Refill  . amLODipine (NORVASC) 10 MG tablet TAKE ONE TABLET BY MOUTH ONCE DAILY 30 tablet 5  . escitalopram (LEXAPRO) 20 MG tablet Take 20 mg by mouth at bedtime.     . hydrochlorothiazide (MICROZIDE) 12.5 MG capsule Take 1 capsule (12.5 mg total) by mouth daily. 30 capsule 2  . metFORMIN (GLUCOPHAGE) 1000 MG tablet Take 1,000 mg by mouth 2 (two) times daily with a meal.    . phentermine 37.5 MG capsule Take 1 capsule (37.5 mg total) by mouth every morning. 30 capsule 3  . traMADol (ULTRAM) 50 MG tablet Take 1 tablet (50 mg total) by mouth every 8 (eight) hours as needed. (Patient not taking: Reported on 02/29/2016) 30 tablet 0   No current facility-administered medications on file prior to visit.     BP 114/84 (BP Location: Left Arm, Patient  Position: Sitting, Cuff Size: Large)   Pulse 87   Temp 98.2 F (36.8 C) (Oral)   Wt 264 lb 3.2 oz (119.8 kg)   SpO2 97%   BMI 42.64 kg/m        Objective:   Physical Exam  Constitutional: She is oriented to person, place, and time. She appears well-developed and well-nourished.  HENT:  Right Ear: Tympanic membrane normal.  Left Ear: Tympanic membrane normal.  Nose: Rhinorrhea present. Right sinus exhibits maxillary sinus tenderness. Right sinus exhibits no frontal sinus tenderness. Left sinus exhibits maxillary sinus tenderness. Left sinus exhibits no frontal sinus tenderness.  Mouth/Throat: Mucous  membranes are normal. No oropharyngeal exudate or posterior oropharyngeal erythema.  Eyes: Pupils are equal, round, and reactive to light. No scleral icterus.  Neck: Neck supple.  Cardiovascular: Normal rate and regular rhythm.   Pulmonary/Chest: Effort normal and breath sounds normal. She has no wheezes. She has no rales.  Abdominal: Soft. Bowel sounds are normal. There is no tenderness.  Lymphadenopathy:    She has no cervical adenopathy.  Neurological: She is alert and oriented to person, place, and time. Coordination normal.  Skin: Skin is warm and dry. No rash noted.  Psychiatric: She has a normal mood and affect. Her behavior is normal. Judgment and thought content normal.       Assessment & Plan:  1. Maxillary sinusitis, unspecified chronicity Symptom duration of  2 weeks; double sickening presentation support treatment for sinusitis. Advised patient to consider nasal saline rinses, mucinex DM, and treatment for symptoms of allergic rhinitis with allegra as needed. Follow up if symptoms do not improve, worsen, or you develop a fever >100. - doxycycline (VIBRA-TABS) 100 MG tablet; Take 1 tablet (100 mg total) by mouth 2 (two) times daily.  Dispense: 20 tablet; Refill: 0  Recommend follow up with PCP as directed.  Roddie McJulia Kaynan Klonowski, FNP-C

## 2016-02-29 NOTE — Telephone Encounter (Signed)
[name], okay to refill Phentermine?

## 2016-02-29 NOTE — Patient Instructions (Signed)
Please take medication as prescribed and you can add allegra and mucinex DM for symptoms. If symptoms persist, worsen, or you develop a fever >100, please follow up for further evaluation and treatment.  Hope you feel better soon!  Sinusitis, Adult Sinusitis is redness, soreness, and inflammation of the paranasal sinuses. Paranasal sinuses are air pockets within the bones of your face. They are located beneath your eyes, in the middle of your forehead, and above your eyes. In healthy paranasal sinuses, mucus is able to drain out, and air is able to circulate through them by way of your nose. However, when your paranasal sinuses are inflamed, mucus and air can become trapped. This can allow bacteria and other germs to grow and cause infection. Sinusitis can develop quickly and last only a short time (acute) or continue over a long period (chronic). Sinusitis that lasts for more than 12 weeks is considered chronic. CAUSES Causes of sinusitis include:  Allergies.  Structural abnormalities, such as displacement of the cartilage that separates your nostrils (deviated septum), which can decrease the air flow through your nose and sinuses and affect sinus drainage.  Functional abnormalities, such as when the small hairs (cilia) that line your sinuses and help remove mucus do not work properly or are not present. SIGNS AND SYMPTOMS Symptoms of acute and chronic sinusitis are the same. The primary symptoms are pain and pressure around the affected sinuses. Other symptoms include:  Upper toothache.  Earache.  Headache.  Bad breath.  Decreased sense of smell and taste.  A cough, which worsens when you are lying flat.  Fatigue.  Fever.  Thick drainage from your nose, which often is green and may contain pus (purulent).  Swelling and warmth over the affected sinuses. DIAGNOSIS Your health care provider will perform a physical exam. During your exam, your health care provider may perform any  of the following to help determine if you have acute sinusitis or chronic sinusitis:  Look in your nose for signs of abnormal growths in your nostrils (nasal polyps).  Tap over the affected sinus to check for signs of infection.  View the inside of your sinuses using an imaging device that has a light attached (endoscope). If your health care provider suspects that you have chronic sinusitis, one or more of the following tests may be recommended:  Allergy tests.  Nasal culture. A sample of mucus is taken from your nose, sent to a lab, and screened for bacteria.  Nasal cytology. A sample of mucus is taken from your nose and examined by your health care provider to determine if your sinusitis is related to an allergy. TREATMENT Most cases of acute sinusitis are related to a viral infection and will resolve on their own within 10 days. Sometimes, medicines are prescribed to help relieve symptoms of both acute and chronic sinusitis. These may include pain medicines, decongestants, nasal steroid sprays, or saline sprays. However, for sinusitis related to a bacterial infection, your health care provider will prescribe antibiotic medicines. These are medicines that will help kill the bacteria causing the infection. Rarely, sinusitis is caused by a fungal infection. In these cases, your health care provider will prescribe antifungal medicine. For some cases of chronic sinusitis, surgery is needed. Generally, these are cases in which sinusitis recurs more than 3 times per year, despite other treatments. HOME CARE INSTRUCTIONS  Drink plenty of water. Water helps thin the mucus so your sinuses can drain more easily.  Use a humidifier.  Inhale steam 3-4 times  a day (for example, sit in the bathroom with the shower running).  Apply a warm, moist washcloth to your face 3-4 times a day, or as directed by your health care provider.  Use saline nasal sprays to help moisten and clean your sinuses.  Take  medicines only as directed by your health care provider.  If you were prescribed either an antibiotic or antifungal medicine, finish it all even if you start to feel better. SEEK IMMEDIATE MEDICAL CARE IF:  You have increasing pain or severe headaches.  You have nausea, vomiting, or drowsiness.  You have swelling around your face.  You have vision problems.  You have a stiff neck.  You have difficulty breathing.   This information is not intended to replace advice given to you by your health care provider. Make sure you discuss any questions you have with your health care provider.   Document Released: 04/11/2005 Document Revised: 05/02/2014 Document Reviewed: 04/26/2011 Elsevier Interactive Patient Education [employer].

## 2016-03-01 MED ORDER — PHENTERMINE HCL 37.5 MG PO CAPS: 37.5000 mg | ORAL_CAPSULE | ORAL | 0 refills | Status: DC

## 2016-03-01 NOTE — Telephone Encounter (Signed)
Left message on mobile voicemail Rx faxed over to Pharmacy as requested. Any questions please call office.

## 2016-03-01 NOTE — Telephone Encounter (Signed)
Ok to refill for one month  

## 2016-03-10 ENCOUNTER — Telehealth: Payer: Self-pay | Admitting: Adult Health

## 2016-03-10 NOTE — Telephone Encounter (Signed)
° °  Pt said she saw Raynelle FanningJulie last week for a sinus issue and was told if not better to call back. She said she was better for a minute but now she is having a lot of green stuff. Would like a call back

## 2016-03-11 NOTE — Telephone Encounter (Signed)
I attempted to contact this patient however did not reach her on her cell phone. If she has taken all of her antibiotic and has not improved, advise that she return for further evaluation to assess her symptoms.

## 2016-03-11 NOTE — Telephone Encounter (Signed)
Spoke to the patient verbalized understanding that if she has taken all her Antibiotics and has not improved to return for further evaluation to assess her symptoms per Roddie McJulia Kordsmeier NFP. Patient stated that she is still taking Antibiotics but if she don't improve she will call and schedule an appointment.

## 2016-03-21 ENCOUNTER — Encounter: Payer: Self-pay | Admitting: Family Medicine

## 2016-03-21 ENCOUNTER — Ambulatory Visit (INDEPENDENT_AMBULATORY_CARE_PROVIDER_SITE_OTHER): Payer: BLUE CROSS/BLUE SHIELD | Admitting: Family Medicine

## 2016-03-21 VITALS — BP 118/86 | HR 74 | Temp 98.5°F | Wt 270.6 lb

## 2016-03-21 DIAGNOSIS — B084 Enteroviral vesicular stomatitis with exanthem: Secondary | ICD-10-CM

## 2016-03-21 DIAGNOSIS — J029 Acute pharyngitis, unspecified: Secondary | ICD-10-CM

## 2016-03-21 LAB — POCT RAPID STREP A (OFFICE): Rapid Strep A Screen: NEGATIVE

## 2016-03-21 MED ORDER — LIDOCAINE VISCOUS 2 % MT SOLN: 10.0000 mL | OROMUCOSAL | 0 refills | Status: DC | PRN

## 2016-03-21 NOTE — Patient Instructions (Signed)
Please use lidocaine mouthwash as directed. Tylenol or ibuprofen as needed for discomfort.  If symptoms do not improve, worsen, or you develop a fever >101, [facility] and [facility].  Pharyngitis Pharyngitis is redness, pain, and swelling (inflammation) of your pharynx. What are the causes? Pharyngitis is usually caused by infection. Most of the time, these infections are from viruses (viral) and are part of a cold. However, sometimes pharyngitis is caused by bacteria (bacterial). Pharyngitis can also be caused by allergies. Viral pharyngitis may be spread from person to person by coughing, sneezing, and personal items or utensils (cups, forks, spoons, toothbrushes). Bacterial pharyngitis may be spread from person to person by more intimate contact, such as kissing. What are the signs or symptoms? Symptoms of pharyngitis include:  Sore throat.  Tiredness (fatigue).  Low-grade fever.  Headache.  Joint pain and muscle aches.  Skin rashes.  Swollen lymph nodes.  Plaque-like film on throat or tonsils (often seen with bacterial pharyngitis). How is this diagnosed? Your health care provider will ask you questions about your illness and your symptoms. Your medical history, along with a physical exam, is often all that is needed to diagnose pharyngitis. Sometimes, a rapid strep test is done. Other lab tests may also be done, depending on the suspected cause. How is this treated? Viral pharyngitis will usually get better in 3-4 days without the use of medicine. Bacterial pharyngitis is treated with medicines that kill germs (antibiotics). Follow these instructions at home:  Drink enough water and fluids to keep your urine clear or pale yellow.  Only take over-the-counter or prescription medicines as directed by your health care provider:  If you are prescribed antibiotics, make sure you finish them even if you start to feel better.  Do not take  aspirin.  Get lots of rest.  Gargle with 8 oz of salt water ( tsp of salt per 1 qt of water) as often as every 1-2 hours to soothe your throat.  Throat lozenges (if you are not at risk for choking) or sprays may be used to soothe your throat. Contact a health care provider if:  You have large, tender lumps in your neck.  You have a rash.  You cough up green, yellow-brown, or bloody spit. Get help right away if:  Your neck becomes stiff.  You drool or are unable to swallow liquids.  You vomit or are unable to keep medicines or liquids down.  You have severe pain that does not go away with the use of recommended medicines.  You have trouble breathing (not caused by a stuffy nose). This information is not intended to replace advice given to you by your health care provider. Make sure you discuss any questions you have with your health care provider. Document Released: 04/11/2005 Document Revised: 09/17/2015 Document Reviewed: 12/17/2012 Elsevier Interactive Patient Education  2017 [employer]

## 2016-03-21 NOTE — Progress Notes (Signed)
Pre visit review using our clinic review tool, if applicable. No additional management support is needed unless otherwise documented below in the visit note. 

## 2016-03-21 NOTE — Progress Notes (Signed)
Subjective:    Patient ID: Kristine GullyAthena Ramsey Alvarado, female    DOB: 02/01/1981, 35 y.o.   MRN: [id number]  HPI  [name] is a 35 year old female who presents today with nasal congestion and mouth lesions that have been present for 6 days.  Associated symptoms of rhinitis with green drainage, sore throat, ear pressure, and post nasal drip.  [name] denies fever, chills, sweats, N/V/D, sinus pressure/pain, and myalgias.  [name] reports recent sick contact exposure with children age 34 and 329.  [name] was treated for maxillary sinusitis 02/29/16 with doxycycline and [name] reports that symptoms improved for a short period of time however these symptoms are present today. [name] reports increased stress with work, school as a Consulting civil engineerstudent, and caring for [name] children. Treatment at home includes benedryl, zyrtec, pseudoephedrine, mucinex dm, and aleve which has provided limited benefit.  [name] is not a smoker  Review of Systems  Constitutional: Negative for chills, fatigue and fever.  HENT: Positive for congestion, mouth sores, postnasal drip, rhinorrhea and sore throat. Negative for sinus pain and sinus pressure.   Eyes: Negative for visual disturbance.  Respiratory: Negative for cough, shortness of breath and wheezing.   Cardiovascular: Negative for chest pain and palpitations.  Gastrointestinal: Negative for abdominal pain, diarrhea, nausea and vomiting.  Musculoskeletal: Negative for myalgias.  Skin: Negative for rash.  Neurological: Negative for dizziness, weakness, light-headedness and headaches.   Past Medical History:  Diagnosis Date  . Depression   . Dysplasia of cervix   . Genital herpes   . Hypertension   . Obesity   . Polycystic ovaries      Social History   Social History  . Marital status: Married    Spouse name: N/A  . Number of children: N/A  . 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 Smoking status: Never Smoker  . Smokeless  tobacco: Never Used  . Alcohol use No  . Drug use: No  . Sexual activity: Yes   Other Topics Concern  . Not on file   Social History Narrative      Married for 13 years, 2 children   [name] likes to ride horses    Past Surgical History:  Procedure Laterality Date  . CESAREAN SECTION  [id number]  . COLPOSCOPY  2014  . TUBAL LIGATION  2010    Family History  Problem Relation Age of Onset  . Uterine cancer Maternal Grandmother   . Thyroid disease Maternal Grandmother 25  . Ovarian cancer Maternal Grandmother   . Hypertension Maternal Grandmother   . Breast cancer Paternal Grandmother 6551    Breast  . Thyroid disease Mother 4030  . Thyroid disease Brother 5224  . Breast cancer Other     Allergies  Allergen Reactions  . Penicillins Rash    Current Outpatient Prescriptions on File Prior to Visit  Medication Sig Dispense Refill  . amLODipine (NORVASC) 10 MG tablet TAKE ONE TABLET BY MOUTH ONCE DAILY 30 tablet 5  . doxycycline (VIBRA-TABS) 100 MG tablet Take 1 tablet (100 mg total) by mouth 2 (two) times daily. 20 tablet 0  . escitalopram (LEXAPRO) 20 MG tablet Take 20 mg by mouth at bedtime.     . hydrochlorothiazide (MICROZIDE) 12.5 MG capsule Take 1 capsule (12.5 mg total) by mouth daily. 30 capsule 2  . metFORMIN (GLUCOPHAGE) 1000 MG tablet Take 1,000 mg by mouth 2 (two) times daily with a  meal.    . phentermine 37.5 MG capsule Take 1 capsule (37.5 mg total) by mouth every morning. 30 capsule 0  . traMADol (ULTRAM) 50 MG tablet Take 1 tablet (50 mg total) by mouth every 8 (eight) hours as needed. (Patient not taking: Reported on 02/29/2016) 30 tablet 0   No current facility-administered medications on file prior to visit.     BP 118/86 (BP Location: Left Arm, Patient Position: Sitting, Cuff Size: Large)   Pulse 74   Temp 98.5 F (36.9 C) (Oral)   Wt 270 lb 9.6 oz (122.7 kg)   SpO2 97%   BMI 43.68 kg/m       Objective:   Physical Exam  Constitutional: [name] is oriented  to person, place, and time. [name] appears well-developed.  HENT:  Right Ear: Tympanic membrane normal.  Left Ear: Tympanic membrane normal.  Nose: Rhinorrhea present. Right sinus exhibits no maxillary sinus tenderness and no frontal sinus tenderness. Left sinus exhibits no maxillary sinus tenderness and no frontal sinus tenderness.  Multiple, small, healing vesicles with erythema halo blisters in oropharyngeal area  Eyes: Pupils are equal, round, and reactive to light. No scleral icterus.  Neck: Neck supple.  Cardiovascular: Normal rate.   Pulmonary/Chest: Effort normal and breath sounds normal. [name] has no wheezes. [name] has no rales.  Abdominal: Soft. Bowel sounds are normal.  Musculoskeletal: [name] exhibits no edema.  Lymphadenopathy:    [name] has cervical adenopathy.  Neurological: [name] is alert and oriented to person, place, and time.  Skin: Skin is warm and dry. No rash noted.  Psychiatric: [name] has a normal mood and affect. [name] behavior is normal. Judgment and thought content normal.          Assessment & Plan:  1. Hand, foot and mouth disease Exam findings suggest hand, foot, and mouth disease. Suspicion is very low for herpetic gingivostomatitis and herpangina. [name] was diagnosed with this viral illness 3 months ago and we discussed considerations the importance of hand hygiene regarding spread of illness. Advised [name] that the viral illness is expected to resolve within 7 to 10 days. Advised use of supportive treatment such as ibuprofen and tylenol for symptom relief.  2. Sore throat  - POCT rapid strep A - lidocaine (XYLOCAINE) 2 % solution; Use as directed 10 mLs in the mouth or throat as needed for mouth pain.  Dispense: 100 mL; Refill: 0  Follow up if symptoms do not improve with PCP.  We discussed stress reduction strategies and the importance of self care.  Roddie McJulia Zalma Channing, FNP-C

## 2016-03-25 ENCOUNTER — Telehealth: Payer: Self-pay | Admitting: Adult Health

## 2016-03-25 ENCOUNTER — Other Ambulatory Visit: Payer: Self-pay

## 2016-03-25 DIAGNOSIS — J029 Acute pharyngitis, unspecified: Secondary | ICD-10-CM

## 2016-03-25 MED ORDER — LIDOCAINE VISCOUS 2 % MT SOLN: 10.0000 mL | OROMUCOSAL | 0 refills | Status: DC | PRN

## 2016-03-25 NOTE — Telephone Encounter (Signed)
Rx sent in

## 2016-03-25 NOTE — Telephone Encounter (Signed)
Is this a new outbreak or the same one from when I saw her? She may need to go see ENT if she continues to have these blisters.   [place] and ok for work note.

## 2016-03-25 NOTE — Telephone Encounter (Signed)
Left message for patient to return phone call.  

## 2016-03-25 NOTE — Telephone Encounter (Signed)
Please advise 

## 2016-03-25 NOTE — Telephone Encounter (Signed)
Pt saw Amil AmenJulia on Monday and was prescribed lidocaine (XYLOCAINE) 2 % solution; Use as directed 10 mLs in the mouth or throat as needed for mouth pain.  Dispense: 100 mL;  Pt is almost out and does not want to go through the weekend without this med since it is only thing that helps.  Pt states she now several more blisters in her mouth, lips, and in nose. Pt would like to know if Kandee KeenCory will send in another rx to help.  [facility]/ [place]  Pt would like to have note for work.  Ok to send through [employer].

## 2016-03-27 ENCOUNTER — Other Ambulatory Visit: Payer: Self-pay | Admitting: Adult Health

## 2016-03-28 ENCOUNTER — Ambulatory Visit (INDEPENDENT_AMBULATORY_CARE_PROVIDER_SITE_OTHER): Payer: BLUE CROSS/BLUE SHIELD | Admitting: Family Medicine

## 2016-03-28 ENCOUNTER — Encounter: Payer: Self-pay | Admitting: Family Medicine

## 2016-03-28 VITALS — BP 116/84 | HR 82 | Temp 98.0°F | Wt 264.4 lb

## 2016-03-28 DIAGNOSIS — B002 Herpesviral gingivostomatitis and pharyngotonsillitis: Secondary | ICD-10-CM | POA: Diagnosis not present

## 2016-03-28 MED ORDER — VALACYCLOVIR HCL 1 G PO TABS: 1000.0000 mg | ORAL_TABLET | Freq: Two times a day (BID) | ORAL | 0 refills | Status: DC

## 2016-03-28 MED ORDER — MUPIROCIN 2 % EX OINT: 1.0000 "application " | TOPICAL_OINTMENT | Freq: Two times a day (BID) | CUTANEOUS | 0 refills | Status: DC

## 2016-03-28 NOTE — Progress Notes (Signed)
Subjective:    Patient ID: [name], female    DOB: 02/15/1981, 35 y.o.   MRN: [id number]  HPI  [name] is a 35 year old female who presents today with mouth lesions that have been present for 8 days.  Associated nasal congestion, rhinitis green drainage, ear pressure, and post nasal drip.  She denies fever, chills, sweats, N/V/D, sinus pressure/pain, and myalgias.  She was seen 03/21/2016 and treated for suspected hand, foot, and mouth disease. She reports that lesions in her throat and sore throat have improved but new lesions have presented on her lips and buccal area. Lesions are described as painful and burning. Treatment at home with lidocaine viscous solution has provided limited benefit. She denies lesions in other areas and reports that they are restricted to oral area.   Review of Systems  Constitutional: Negative for chills, fatigue and fever.  HENT: Positive for congestion, postnasal drip and rhinorrhea.        Mouth, tongue, and lip lesions  Respiratory: Negative for cough, shortness of breath and wheezing.   Cardiovascular: Negative for chest pain and palpitations.  Gastrointestinal: Negative for abdominal pain, diarrhea, nausea and vomiting.  Genitourinary: Negative for dysuria and hematuria.  Musculoskeletal: Negative for myalgias.  Skin: Negative for rash.  Neurological: Negative for dizziness, light-headedness and headaches.   Past Medical History:  Diagnosis Date  . Depression   . Dysplasia of cervix   . Genital herpes   . Hypertension   . Obesity   . Polycystic ovaries      Social History   Social History  . Marital status: Married    Spouse name: N/A  . Number of children: N/A  . 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 Smoking status: Never Smoker  . Smokeless tobacco: Never Used  . Alcohol use No  . Drug use: No  . Sexual activity: Yes   Other Topics Concern  . Not on file     Social History Narrative      Married for 13 years, 2 children   She likes to ride horses    Past Surgical History:  Procedure Laterality Date  . CESAREAN SECTION  [id number]  . COLPOSCOPY  2014  . TUBAL LIGATION  2010    Family History  Problem Relation Age of Onset  . Uterine cancer Maternal Grandmother   . Thyroid disease Maternal Grandmother 25  . Ovarian cancer Maternal Grandmother   . Hypertension Maternal Grandmother   . Breast cancer Paternal Grandmother 1351    Breast  . Thyroid disease Mother 4230  . Thyroid disease Brother 3824  . Breast cancer Other     Allergies  Allergen Reactions  . Penicillins Rash    Current Outpatient Prescriptions on File Prior to Visit  Medication Sig Dispense Refill  . amLODipine (NORVASC) 10 MG tablet TAKE ONE TABLET BY MOUTH ONCE DAILY 30 tablet 5  . escitalopram (LEXAPRO) 20 MG tablet Take 20 mg by mouth at bedtime.     . hydrochlorothiazide (MICROZIDE) 12.5 MG capsule Take 1 capsule (12.5 mg total) by mouth daily. 30 capsule 2  . lidocaine (XYLOCAINE) 2 % solution Use as directed 10 mLs in the mouth or throat as needed for mouth pain. 100 mL 0  . metFORMIN (GLUCOPHAGE) 1000 MG tablet Take 1,000 mg by mouth 2 (two) times daily with a meal.    . phentermine 37.5 MG capsule  Take 1 capsule (37.5 mg total) by mouth every morning. 30 capsule 0  . traMADol (ULTRAM) 50 MG tablet Take 1 tablet (50 mg total) by mouth every 8 (eight) hours as needed. (Patient not taking: Reported on 02/29/2016) 30 tablet 0   No current facility-administered medications on file prior to visit.     BP 116/84 (BP Location: Left Arm, Patient Position: Sitting, Cuff Size: Normal)   Pulse 82   Temp 98 F (36.7 C) (Oral)   Wt 264 lb 6.4 oz (119.9 kg)   SpO2 97%   BMI 42.68 kg/m       Objective:   Physical Exam  Constitutional: She is oriented to person, place, and time. She appears well-developed and well-nourished.  HENT:  Right Ear: Tympanic membrane  normal.  Left Ear: Tympanic membrane normal.  Nose: No rhinorrhea. Right sinus exhibits no maxillary sinus tenderness and no frontal sinus tenderness. Left sinus exhibits no maxillary sinus tenderness and no frontal sinus tenderness.  Clustered area of vesicular lesions on lower lip, buccal area, and palate noted. No drainage or erythema present.    Eyes: Pupils are equal, round, and reactive to light. No scleral icterus.  Neck: Neck supple.  Cardiovascular: Normal rate and regular rhythm.   Pulmonary/Chest: Effort normal and breath sounds normal. She has no wheezes. She has no rales.  Lymphadenopathy:    She has no cervical adenopathy.  Neurological: She is alert and oriented to person, place, and time.  Skin: Skin is warm and dry. No rash noted.  Psychiatric: Her behavior is normal. Judgment and thought content normal.        Assessment & Plan:  1. Herpetic gingivostomatitis Suspect that symptoms are viral in nature likely related to an outbreak that is herpetic in nature. Advised patient that symptoms can last 10 to 14 days. We discussed the importance of hand hygiene and spreading of this illness. Advised use of ibuprofen or tylenol for discomfort. She also has viscous lidocaine for discomfort. - valACYclovir (VALTREX) 1000 MG tablet; Take 1 tablet (1,000 mg total) by mouth 2 (two) times daily.  Dispense: 20 tablet; Refill: 0  Follow up if symptoms do not improve, worsens, or she develops new symptoms.  Kristine McJulia Delsie Amador, FNP-C

## 2016-03-28 NOTE — Progress Notes (Signed)
Pre visit review using our clinic review tool, if applicable. No additional management support is needed unless otherwise documented below in the visit note. 

## 2016-03-28 NOTE — Patient Instructions (Signed)
Please take medication as directed. Also, you may use mupirocin for irritation to nasal area. If symptoms do not improve, worsen, or you develop new symptoms, [facility]

## 2016-03-29 NOTE — Telephone Encounter (Signed)
Left message for patient to return phone call.  

## 2016-03-31 NOTE — Telephone Encounter (Signed)
Left message for patient to return phone call.  

## 2016-04-07 NOTE — Telephone Encounter (Signed)
Rx has been called in  

## 2016-04-07 NOTE — Telephone Encounter (Signed)
Patient's current weight is 258.6 lbs. Ok to refill?

## 2016-04-07 NOTE — Telephone Encounter (Signed)
Ok to refill 

## 2016-07-18 ENCOUNTER — Emergency Department (HOSPITAL_COMMUNITY): Payer: BLUE CROSS/BLUE SHIELD

## 2016-07-18 ENCOUNTER — Encounter (HOSPITAL_COMMUNITY): Payer: Self-pay

## 2016-07-18 ENCOUNTER — Emergency Department (HOSPITAL_COMMUNITY)
Admission: EM | Admit: 2016-07-18 | Discharge: 2016-07-19 | Disposition: A | Discharge status: Home / Self Care | Payer: BLUE CROSS/BLUE SHIELD | Attending: Emergency Medicine | Admitting: Emergency Medicine

## 2016-07-18 DIAGNOSIS — R1011 Right upper quadrant pain: Secondary | ICD-10-CM | POA: Diagnosis present

## 2016-07-18 DIAGNOSIS — I10 Essential (primary) hypertension: Secondary | ICD-10-CM | POA: Diagnosis not present

## 2016-07-18 DIAGNOSIS — K8020 Calculus of gallbladder without cholecystitis without obstruction: Secondary | ICD-10-CM | POA: Insufficient documentation

## 2016-07-18 DIAGNOSIS — Z79899 Other long term (current) drug therapy: Secondary | ICD-10-CM | POA: Diagnosis not present

## 2016-07-18 LAB — CBC
HEMATOCRIT: 36.6 % (ref 36.0–46.0)
Hemoglobin: 11.7 g/dL — ABNORMAL LOW (ref 12.0–15.0)
MCH: 28.1 pg (ref 26.0–34.0)
MCHC: 32 g/dL (ref 30.0–36.0)
MCV: 87.8 fL (ref 78.0–100.0)
PLATELETS: 183 10*3/uL (ref 150–400)
RBC: 4.17 MIL/uL (ref 3.87–5.11)
RDW: 13.9 % (ref 11.5–15.5)
WBC: 4.4 10*3/uL (ref 4.0–10.5)

## 2016-07-18 LAB — URINALYSIS, ROUTINE W REFLEX MICROSCOPIC
BILIRUBIN URINE: NEGATIVE
Glucose, UA: NEGATIVE mg/dL
Hgb urine dipstick: NEGATIVE
KETONES UR: NEGATIVE mg/dL
LEUKOCYTES UA: NEGATIVE
Nitrite: NEGATIVE
PH: 6 (ref 5.0–8.0)
PROTEIN: NEGATIVE mg/dL
Specific Gravity, Urine: 1.024 (ref 1.005–1.030)

## 2016-07-18 LAB — COMPREHENSIVE METABOLIC PANEL
ALK PHOS: 233 U/L — AB (ref 38–126)
ALT: 499 U/L — AB (ref 14–54)
AST: 449 U/L — AB (ref 15–41)
Albumin: 3.7 g/dL (ref 3.5–5.0)
Anion gap: 6 (ref 5–15)
BILIRUBIN TOTAL: 1.1 mg/dL (ref 0.3–1.2)
BUN: 10 mg/dL (ref 6–20)
CO2: 31 mmol/L (ref 22–32)
CREATININE: 0.72 mg/dL (ref 0.44–1.00)
Calcium: 8.9 mg/dL (ref 8.9–10.3)
Chloride: 102 mmol/L (ref 101–111)
GFR calc Af Amer: >60 mL/min (ref >60)
GFR calc non Af Amer: >60 mL/min (ref >60)
GLUCOSE: 87 mg/dL (ref 65–99)
Potassium: 3.7 mmol/L (ref 3.5–5.1)
Sodium: 139 mmol/L (ref 135–145)
TOTAL PROTEIN: 6.4 g/dL — AB (ref 6.5–8.1)

## 2016-07-18 LAB — I-STAT BETA HCG BLOOD, ED (MC, WL, AP ONLY): I-stat hCG, quantitative: <5 m[IU]/mL (ref <5)

## 2016-07-18 LAB — LIPASE, BLOOD: Lipase: 17 U/L (ref 11–51)

## 2016-07-18 MED ORDER — MORPHINE SULFATE (PF) 4 MG/ML IV SOLN
4.0000 mg | Freq: Once | INTRAVENOUS | Status: AC
  Administered 2016-07-18: 4 mg via INTRAVENOUS
  Filled 2016-07-18: qty 1

## 2016-07-18 MED ORDER — SODIUM CHLORIDE 0.9 % IV BOLUS (SEPSIS)
1000.0000 mL | Freq: Once | INTRAVENOUS | Status: AC
  Administered 2016-07-18: 1000 mL via INTRAVENOUS

## 2016-07-18 MED ORDER — ONDANSETRON HCL 4 MG/2ML IJ SOLN
4.0000 mg | Freq: Once | INTRAMUSCULAR | Status: AC
  Administered 2016-07-18: 4 mg via INTRAVENOUS
  Filled 2016-07-18: qty 2

## 2016-07-18 NOTE — ED Triage Notes (Signed)
Pt reports epigastric abdominal pain, going on for 2 weeks. She thinks it may be her gallbladder and is supposed to have it removed but was unable to get time off work to have it removed. She reports her urine has appeared a dark orange color.

## 2016-07-18 NOTE — ED Provider Notes (Signed)
[facility]-EMERGENCY DEPT Provider Note   CSN: [id number] Arrival date & time: 07/18/16  1635     History   Chief Complaint Chief Complaint  Patient presents with  . Abdominal Pain    HPI [name] is a 36 y.o. female.  The history is provided by the patient and medical records.  Abdominal Pain   This is a recurrent problem. The current episode started more than 1 week ago. The problem occurs daily. The problem has been gradually worsening. The pain is associated with eating. The pain is located in the RUQ and epigastric region. The pain is at a severity of 10/10. The pain is severe. Associated symptoms include nausea and vomiting. Pertinent negatives include anorexia, fever, belching, diarrhea, hematochezia, melena, constipation, dysuria, frequency, hematuria, headaches, arthralgias and myalgias. Nothing relieves the symptoms. Past workup includes ultrasound. Her past medical history is significant for gallstones.    36 y.o. female PMH chololithiasis, HTN presents for two weeks intermittent RUQ pain. Pain 3/10 but colickey and increases to 10. Reports worsening nausea and one episode NBNB emesis this morning. Denies fever, chills, urinary symptoms, diarrhea. Pain is worse after eating and every afternoon.    Past Medical History:  Diagnosis Date  . Depression   . Dysplasia of cervix   . Genital herpes   . Hypertension   . Obesity   . Polycystic ovaries     There are no active problems to display for this patient.   Past Surgical History:  Procedure Laterality Date  . CESAREAN SECTION  [id number]  . COLPOSCOPY  2014  . TUBAL LIGATION  2010    OB History    No data available       Home Medications    Prior to Admission medications   Medication Sig Start Date End Date Taking? Authorizing Provider  escitalopram (LEXAPRO) 20 MG tablet Take 20 mg by mouth at bedtime.    Yes Historical Provider, MD  hydrochlorothiazide (MICROZIDE) 12.5 MG capsule Take 1 capsule  (12.5 mg total) by mouth daily. 02/25/16  Yes Shirline Frees, NP  amLODipine (NORVASC) 10 MG tablet TAKE ONE TABLET BY MOUTH ONCE DAILY Patient not taking: Reported on 07/18/2016 01/27/16   Shirline Frees, NP  lidocaine (XYLOCAINE) 2 % solution Use as directed 10 mLs in the mouth or throat as needed for mouth pain. Patient not taking: Reported on 07/18/2016 03/25/16   Shirline Frees, NP  mupirocin ointment (BACTROBAN) 2 % Place 1 application into the nose 2 (two) times daily. Patient not taking: Reported on 07/18/2016 03/28/16   Roddie Mc, FNP  ondansetron (ZOFRAN) 4 MG tablet Take 1 tablet (4 mg total) by mouth every 6 (six) hours. 07/19/16   Pablo Ledger, MD  oxyCODONE-acetaminophen (PERCOCET) 5-325 MG tablet Take 1 tablet by mouth every 6 (six) hours as needed for severe pain. 07/19/16   Pablo Ledger, MD  phentermine 37.5 MG capsule TAKE ONE CAPSULE BY MOUTH ONCE DAILY IN THE MORNING Patient not taking: Reported on 07/18/2016 04/07/16   Shirline Frees, NP  traMADol (ULTRAM) 50 MG tablet Take 1 tablet (50 mg total) by mouth every 8 (eight) hours as needed. Patient not taking: Reported on 02/29/2016 06/18/15   Shirline Frees, NP  valACYclovir (VALTREX) 1000 MG tablet Take 1 tablet (1,000 mg total) by mouth 2 (two) times daily. Patient not taking: Reported on 07/18/2016 03/28/16   Roddie Mc, FNP    Family History Family History  Problem Relation Age of Onset  . Uterine cancer  Maternal Grandmother   . Thyroid disease Maternal Grandmother 25  . Ovarian cancer Maternal Grandmother   . Hypertension Maternal Grandmother   . Breast cancer Paternal Grandmother 45    Breast  . Thyroid disease Mother 20  . Thyroid disease Brother 8  . Breast cancer Other     Social History Social History  Substance Use Topics  . Smoking status: Never Smoker  . Smokeless tobacco: Never Used  . Alcohol use No     Allergies   Penicillins   Review of Systems Review of Systems    Constitutional: Negative for chills and fever.  Respiratory: Negative for shortness of breath.   Cardiovascular: Negative for chest pain and palpitations.  Gastrointestinal: Positive for abdominal pain, nausea and vomiting. Negative for abdominal distention, anal bleeding, anorexia, blood in stool, constipation, diarrhea, hematochezia and melena.  Genitourinary: Negative for dysuria, frequency, hematuria and urgency.  Musculoskeletal: Negative for arthralgias and myalgias.  Neurological: Negative for light-headedness and headaches.  All other systems reviewed and are negative.    Physical Exam Updated Vital Signs BP (!) 128/50   Pulse 66   Temp 99 F (37.2 C) (Oral)   Resp 16   Ht 5\' 6"  (1.676 m)   Wt 121.6 kg   LMP  (Within Years)   SpO2 99%   BMI 43.26 kg/m   Physical Exam  Constitutional: She is oriented to person, place, and time. She appears well-developed and well-nourished. No distress.  HENT:  Head: Normocephalic and atraumatic.  Eyes: Conjunctivae and EOM are normal.  Neck: Normal range of motion. Neck supple.  Cardiovascular: Normal rate and regular rhythm.   No murmur heard. Pulmonary/Chest: Effort normal and breath sounds normal. No respiratory distress.  Abdominal: Soft. There is tenderness in the right upper quadrant and epigastric area. There is positive Murphy's sign. There is no rigidity, no rebound, no guarding, no CVA tenderness and no tenderness at McBurney's point.  Musculoskeletal: Normal range of motion. She exhibits no edema.  Neurological: She is alert and oriented to person, place, and time.  Skin: Skin is warm and dry. Capillary refill takes less than 2 seconds.  Psychiatric: She has a normal mood and affect.  Nursing note and vitals reviewed.    ED Treatments / Results  Labs (all labs ordered are listed, but only abnormal results are displayed) Labs Reviewed  COMPREHENSIVE METABOLIC PANEL - Abnormal; Notable for the following:       Result  Value   Total Protein 6.4 (*)    AST 449 (*)    ALT 499 (*)    Alkaline Phosphatase 233 (*)    All other components within normal limits  CBC - Abnormal; Notable for the following:    Hemoglobin 11.7 (*)    All other components within normal limits  LIPASE, BLOOD  URINALYSIS, ROUTINE W REFLEX MICROSCOPIC  I-STAT BETA HCG BLOOD, ED ([facility], [facility], [facility] ONLY)    EKG  EKG Interpretation None       Radiology US Abdomen Limited Ruq  Result Date: 07/19/2016 CLINICAL DATA:  Epigastric pain x2 weeks EXAM: US ABDOMEN LIMITED - RIGHT UPPER QUADRANT COMPARISON:  06/17/2015 FINDINGS: Gallbladder: Gallbladder is physiologically distended without wall thickening. The gallbladder wall is 2 mm in thickness which is within normal limits. Cholelithiasis noted at the gallbladder neck the largest is 1.5 cm. No sonographic Murphy sign noted by sonographer. Common bile duct: Diameter: 3.6 mm and normal. Liver: No focal lesion identified.  Increased echogenicity of the liver. IMPRESSION:  1. Uncomplicated cholelithiasis. No evidence of acute cholecystitis. 2. Echogenic liver parenchyma consistent with hepatic steatosis. Electronically Signed   By: Tollie Ethavid  Kwon M.D.   On: 07/19/2016 00:11    Procedures Procedures (including critical care time)  Medications Ordered in ED Medications  sodium chloride 0.9 % bolus 1,000 mL (0 mLs Intravenous Stopped 07/18/16 2215)  ondansetron (ZOFRAN) injection 4 mg (4 mg Intravenous Given 07/18/16 2014)  morphine 4 MG/ML injection 4 mg (4 mg Intravenous Given 07/18/16 2014)     Initial Impression / Assessment and Plan / ED Course  I have reviewed the triage vital signs and the nursing notes.  Pertinent labs & imaging results that were available during my care of the patient were reviewed by me and considered in my medical decision making (see chart for details).    36 y.o. female presents for colicky RUQ pain concerning for cholecystitis. VSS, NAD. Significant pain to epigastric  and RUQ area. + McBurney's. Abdomen soft w/o rebound. Elevated LFTs w/o leukocytosis. Urine pregnancy negative. UA w/o infection. US shows cholelithiasis w/o infection. No indication for emergent surgery. Symptoms improved with IVF, zofran and pain medication. Will give Rx zofran and short course pain medication. Will e-mail Dr Andrey CampanileWilson for quick follow-up. Pt voiced understanding and agreement with plan.   Final Clinical Impressions(s) / ED Diagnoses   Final diagnoses:  Calculus of gallbladder without cholecystitis without obstruction    New Prescriptions Discharge Medication List as of 07/19/2016 12:19 AM    START taking these medications   Details  ondansetron (ZOFRAN) 4 MG tablet Take 1 tablet (4 mg total) by mouth every 6 (six) hours., Starting Tue 07/19/2016, Print    oxyCODONE-acetaminophen (PERCOCET) 5-325 MG tablet Take 1 tablet by mouth every 6 (six) hours as needed for severe pain., Starting Tue 07/19/2016, Print         Pablo LedgerElizabeth Mitchell Etheline Geppert, MD 07/19/16 0157    Gerhard Munchobert Lockwood, MD 07/19/16 1006

## 2016-07-19 MED ORDER — OXYCODONE-ACETAMINOPHEN 5-325 MG PO TABS: 1.0000 | ORAL_TABLET | Freq: Four times a day (QID) | ORAL | 0 refills | Status: DC | PRN

## 2016-07-19 MED ORDER — ONDANSETRON HCL 4 MG PO TABS: 4.0000 mg | ORAL_TABLET | Freq: Four times a day (QID) | ORAL | 0 refills | Status: DC

## 2016-07-27 ENCOUNTER — Ambulatory Visit: Payer: Self-pay | Admitting: General Surgery

## 2016-07-27 NOTE — H&P (Signed)
History of Present Illness Axel Filler MD; 07/27/2016 12:42 PM) Patient words: NP GB.  The patient is a 36 year old female who presents for evaluation of gall stones. The patient is a 36 year old female with a one-year history of symptomatically cholelithiasis. Patient had epigastric right upper quadrant pain. She states most recently this took her to the ER. She states was wiassociated th nausea and vomiting. She states that the pain is been consistent the right upper quadrant. She states the pain medication is helping with the pain. She states she's been on a bland diet. She does state that is associated with nausea vomiting, fever no diarrhea, no fevers, chills. All other reviews systems are negative.   Past Surgical History Christianne Dolin, [place]; 07/27/2016 12:19 PM) Cesarean Section - Multiple   Diagnostic Studies History Christianne Dolin, [place]; 07/27/2016 12:19 PM) Colonoscopy  never Mammogram  never Pap Smear  1-5 years ago  Allergies Christianne Dolin, RMA; 07/27/2016 12:20 PM) Penicillin G Pot in Dextrose *PENICILLINS*   Medication History Christianne Dolin, RMA; 07/27/2016 12:21 PM) Escitalopram Oxalate (  Tablet, Oral) Active. HydroCHLOROthiazide (12.5MG  Capsule, Oral) Active. Lidocaine Viscous (2% Solution, Mouth/Throat) Active. ValACYclovir HCl (1GM Tablet, Oral as needed) Active. Ondansetron HCl (  Tablet, Oral) Active. Medications Reconciled  Social History Christianne Dolin, [place]; 07/27/2016 12:19 PM) Alcohol use  Occasional alcohol use. Caffeine use  Carbonated beverages, Coffee, Tea. No drug use  Tobacco use  Never smoker.  Family History Christianne Dolin, [place]; 07/27/2016 12:19 PM) Alcohol Abuse  Mother. Depression  Mother. Heart disease in female family member before age 63  Hypertension  Father, Mother. Thyroid problems  Brother, Mother.  Pregnancy / Birth History Christianne Dolin, [place]; 07/27/2016 12:19 PM) Age at menarche  12  years. Contraceptive History  Oral contraceptives. Gravida  2 Irregular periods  Length (months) of breastfeeding  7-12 Maternal age  4-20 Para  2  Other Problems Christianne Dolin, [place]; 07/27/2016 12:19 PM) Anxiety Disorder  Depression  High blood pressure  Other disease, cancer, significant illness     Review of Systems Christianne Dolin RMA; 07/27/2016 12:19 PM) General Present- Fever. Not Present- Appetite Loss, Chills, Fatigue, Night Sweats, Weight Gain and Weight Loss. Skin Not Present- Change in Wart/Mole, Dryness, Hives, Jaundice, New Lesions, Non-Healing Wounds, Rash and Ulcer. HEENT Not Present- Earache, Hearing Loss, Hoarseness, Nose Bleed, Oral Ulcers, Ringing in the Ears, Seasonal Allergies, Sinus Pain, Sore Throat, Visual Disturbances, Wears glasses/contact lenses and Yellow Eyes. Respiratory Not Present- Bloody sputum, Chronic Cough, Difficulty Breathing, Snoring and Wheezing. Breast Not Present- Breast Mass, Breast Pain, Nipple Discharge and Skin Changes. Cardiovascular Not Present- Chest Pain, Difficulty Breathing Lying Down, Leg Cramps, Palpitations, Rapid Heart Rate, Shortness of Breath and Swelling of Extremities. Gastrointestinal Present- Abdominal Pain, Bloating, Change in Bowel Habits, Excessive gas, Gets full quickly at meals, Indigestion, Nausea and Vomiting. Not Present- Bloody Stool, Chronic diarrhea, Constipation, Difficulty Swallowing, Hemorrhoids and Rectal Pain. Female Genitourinary Not Present- Frequency, Nocturia, Painful Urination, Pelvic Pain and Urgency. Musculoskeletal Not Present- Back Pain, Joint Pain, Joint Stiffness, Muscle Pain, Muscle Weakness and Swelling of Extremities. Neurological Not Present- Decreased Memory, Fainting, Headaches, Numbness, Seizures, Tingling, Tremor, Trouble walking and Weakness. Psychiatric Present- Anxiety. Not Present- Bipolar, Change in Sleep Pattern, Depression, Fearful and Frequent crying. Endocrine Not  Present- Cold Intolerance, Excessive Hunger, Hair Changes, Heat Intolerance, Hot flashes and New Diabetes. Hematology Not Present- Blood Thinners, Easy Bruising, Excessive bleeding, Gland problems, HIV and Persistent Infections.  Vitals Christianne Dolin RMA; 07/27/2016 12:21 PM) 07/27/2016 12:21 PM  Weight: 270 lb Height: 66in Body Surface Area: 2.27 m Body Mass Index: 43.58 kg/m  Temp.: 98.76F  Pulse: 72 (Regular)  BP: 140/92 (Sitting, Left Arm, Standard)       Physical Exam Axel Filler, MD; 07/27/2016 12:43 PM) General Mental Status-Alert. General Appearance-Consistent with stated age. Hydration-Well hydrated. Voice-Normal.  Head and Neck Head-normocephalic, atraumatic with no lesions or palpable masses.  Eye Eyeball - Bilateral-Extraocular movements intact. Sclera/Conjunctiva - Bilateral-No scleral icterus.  Chest and Lung Exam Chest and lung exam reveals -quiet, even and easy respiratory effort with no use of accessory muscles. Inspection Chest Wall - Normal. Back - normal.  Cardiovascular Cardiovascular examination reveals -normal heart sounds, regular rate and rhythm with no murmurs.  Abdomen Inspection Normal Exam - No Hernias. Palpation/Percussion Normal exam - Soft, Non Tender, No Rebound tenderness, No Rigidity (guarding) and No hepatosplenomegaly. Auscultation Normal exam - Bowel sounds normal.  Neurologic Neurologic evaluation reveals -alert and oriented x 3 with no impairment of recent or remote memory. Mental Status-Normal.  Musculoskeletal Normal Exam - Left-Upper Extremity Strength Normal and Lower Extremity Strength Normal. Normal Exam - Right-Upper Extremity Strength Normal, Lower Extremity Weakness.    Assessment & Plan Axel Filler MD; 07/27/2016 12:42 PM) SYMPTOMATIC CHOLELITHIASIS (K80.20) Impression: 36 year old female sent by cholelithiasis 1. We will proceed to the operating room for a  laparoscopic cholecystectomy  2. Risks and benefits were discussed with the patient to generally include, but not limited to: infection, bleeding, possible need for post op ERCP, damage to the bile ducts, bile leak, and possible need for further surgery. Alternatives were offered and described. All questions were answered and the patient voiced understanding of the procedure and wishes to proceed at this point with a laparoscopic cholecystectomy

## 2016-08-11 NOTE — Pre-Procedure Instructions (Signed)
[name] Camille  08/11/2016      [facility] [address] [place] - [address] [place] [address] Phone: [phone] Fax: [phone]    Your procedure is scheduled on 08-19-2016. Friday    Report to [facility] Admitting at 9:00 AM    Call this number if you have problems the morning of surgery:  [phone]   Remember:  Do not eat food or drink liquids after midnight.   Take these medicines the morning of surgery with A SIP OF WATER Lexapro  STOP ASPIRIN,ANTIINFLAMATORIES (IBUPROFEN,ALEVE,MOTRIN,ADVIL,GOODY'S POWDERS),HERBAL SUPPLEMENTS,FISH OIL,AND VITAMINS 5-7 DAYS PRIOR TO SURGERY   Do not wear jewelry, make-up or nail polish.  Do not wear lotions, powders, or perfumes, or deoderant.  Do not shave 48 hours prior to surgery.  Men may shave face and neck .  Do not bring valuables to the hospital.  [facility] is not responsible for any belongings or valuables.  Contacts, dentures or bridgework may not be worn into surgery.  Leave your suitcase in the car.  After surgery it may be brought to your room.  For patients admitted to the hospital, discharge time will be determined by your treatment team.  Patients discharged the day of surgery will not be allowed to drive home.   Special Instructions: [place] - Preparing for Surgery  Before surgery, you can play an important role.  Because skin is not sterile, your skin needs to be as free of germs as possible.  You can reduce the number of germs on you skin by washing with CHG (chlorahexidine gluconate) soap before surgery.  CHG is an antiseptic cleaner which kills germs and bonds with the skin to continue killing germs even after washing.  Please DO NOT use if you have an allergy to CHG or antibacterial soaps.  If your skin becomes reddened/irritated stop using the CHG and inform your nurse when you arrive at Short Stay.  Do not shave (including legs and underarms) for  at least 48 hours prior to the first CHG shower.  You may shave your face.  Please follow these instructions carefully:   1.  Shower with CHG Soap the night before surgery and the   morning of Surgery.  2.  If you choose to wash your hair, wash your hair first as usual with your normal shampoo.  3.  After you shampoo, rinse your hair and body thoroughly to remove the  Shampoo.  4.  Use CHG as you would any other liquid soap.  You can apply chg directly  to the skin and wash gently with scrungie or a clean washcloth.  5.  Apply the CHG Soap to your body ONLY FROM THE NECK DOWN.   Do not use on open wounds or open sores.  Avoid contact with your eyes,  ears, mouth and genitals (private parts).  Wash genitals (private parts) with your normal soap.  6.  Wash thoroughly, paying special attention to the area where your surgery will be performed.  7.  Thoroughly rinse your body with warm water from the neck down.  8.  DO NOT shower/wash with your normal soap after using and rinsing o  the CHG Soap.  9.  Pat yourself dry with a clean towel.            10.  Wear clean pajamas.            11.  Place clean sheets on  your bed the night of your first shower and do not sleep with pets.  Day of Surgery  Do not apply any lotions/deodorants the morning of surgery.  Please wear clean clothes to the hospital/surgery center.   Please read over the following fact sheets that you were given. Pain Booklet, Coughing and Deep Breathing and Surgical Site Infection Prevention

## 2016-08-12 ENCOUNTER — Other Ambulatory Visit (HOSPITAL_COMMUNITY): Payer: Self-pay | Admitting: *Deleted

## 2016-08-12 ENCOUNTER — Encounter (HOSPITAL_COMMUNITY): Payer: Self-pay

## 2016-08-12 ENCOUNTER — Encounter (HOSPITAL_COMMUNITY)
Admission: RE | Admit: 2016-08-12 | Discharge: 2016-08-12 | Disposition: A | Discharge status: Home / Self Care | Payer: BLUE CROSS/BLUE SHIELD | Source: Ambulatory Visit | Attending: General Surgery | Admitting: General Surgery

## 2016-08-12 DIAGNOSIS — Z01812 Encounter for preprocedural laboratory examination: Secondary | ICD-10-CM | POA: Insufficient documentation

## 2016-08-12 DIAGNOSIS — Z01810 Encounter for preprocedural cardiovascular examination: Secondary | ICD-10-CM | POA: Insufficient documentation

## 2016-08-12 DIAGNOSIS — Z01818 Encounter for other preprocedural examination: Secondary | ICD-10-CM | POA: Diagnosis present

## 2016-08-12 HISTORY — DX: Anemia, unspecified: D64.9

## 2016-08-12 LAB — BASIC METABOLIC PANEL
ANION GAP: 7 (ref 5–15)
BUN: 9 mg/dL (ref 6–20)
CALCIUM: 8.9 mg/dL (ref 8.9–10.3)
CO2: 30 mmol/L (ref 22–32)
Chloride: 102 mmol/L (ref 101–111)
Creatinine, Ser: 0.7 mg/dL (ref 0.44–1.00)
GLUCOSE: 106 mg/dL — AB (ref 65–99)
POTASSIUM: 3.7 mmol/L (ref 3.5–5.1)
Sodium: 139 mmol/L (ref 135–145)

## 2016-08-12 LAB — HCG, SERUM, QUALITATIVE: PREG SERUM: NEGATIVE

## 2016-08-12 LAB — CBC
HEMATOCRIT: 36.5 % (ref 36.0–46.0)
HEMOGLOBIN: 12 g/dL (ref 12.0–15.0)
MCH: 28.5 pg (ref 26.0–34.0)
MCHC: 32.9 g/dL (ref 30.0–36.0)
MCV: 86.7 fL (ref 78.0–100.0)
Platelets: 155 10*3/uL (ref 150–400)
RBC: 4.21 MIL/uL (ref 3.87–5.11)
RDW: 13.5 % (ref 11.5–15.5)
WBC: 5.8 10*3/uL (ref 4.0–10.5)

## 2016-08-19 ENCOUNTER — Encounter (HOSPITAL_COMMUNITY): Admission: RE | Payer: Self-pay | Source: Ambulatory Visit

## 2016-08-19 ENCOUNTER — Ambulatory Visit (HOSPITAL_COMMUNITY)
Admission: RE | Admit: 2016-08-19 | Payer: BLUE CROSS/BLUE SHIELD | Source: Ambulatory Visit | Admitting: General Surgery

## 2016-08-19 SURGERY — LAPAROSCOPIC CHOLECYSTECTOMY
Anesthesia: General

## 2016-08-29 ENCOUNTER — Encounter (HOSPITAL_COMMUNITY): Payer: Self-pay | Admitting: *Deleted

## 2016-08-29 NOTE — Progress Notes (Signed)
Pt denies SOB, chest pain, and being under the care of a cardiologist. Pt denies having a stress test, echo and cardiac cath. Pt denies having a chest x ray within the last year. Pt made aware to stop taking  Aspirin, vitamins, fish oil and herbal medications. Do not take any NSAIDs ie: Ibuprofen, Advil, Naproxen, BC and Goody Powder or any medication containing Aspirin. Pt verbalized understanding of all pre-op instructions. 

## 2016-08-30 ENCOUNTER — Encounter (HOSPITAL_COMMUNITY)
Admission: RE | Disposition: A | Discharge status: Home / Self Care | Payer: Self-pay | Source: Ambulatory Visit | Attending: General Surgery

## 2016-08-30 ENCOUNTER — Encounter (HOSPITAL_COMMUNITY): Payer: Self-pay | Admitting: Urology

## 2016-08-30 ENCOUNTER — Ambulatory Visit (HOSPITAL_COMMUNITY)
Admission: RE | Admit: 2016-08-30 | Discharge: 2016-08-30 | Disposition: A | Discharge status: Home / Self Care | Payer: BLUE CROSS/BLUE SHIELD | Source: Ambulatory Visit | Attending: General Surgery | Admitting: General Surgery

## 2016-08-30 ENCOUNTER — Ambulatory Visit (HOSPITAL_COMMUNITY): Payer: BLUE CROSS/BLUE SHIELD | Admitting: Anesthesiology

## 2016-08-30 DIAGNOSIS — I10 Essential (primary) hypertension: Secondary | ICD-10-CM | POA: Insufficient documentation

## 2016-08-30 DIAGNOSIS — K8010 Calculus of gallbladder with chronic cholecystitis without obstruction: Secondary | ICD-10-CM | POA: Diagnosis not present

## 2016-08-30 DIAGNOSIS — F419 Anxiety disorder, unspecified: Secondary | ICD-10-CM | POA: Diagnosis not present

## 2016-08-30 DIAGNOSIS — F329 Major depressive disorder, single episode, unspecified: Secondary | ICD-10-CM | POA: Insufficient documentation

## 2016-08-30 DIAGNOSIS — Z79899 Other long term (current) drug therapy: Secondary | ICD-10-CM | POA: Insufficient documentation

## 2016-08-30 DIAGNOSIS — K8080 Other cholelithiasis without obstruction: Secondary | ICD-10-CM | POA: Diagnosis present

## 2016-08-30 HISTORY — DX: Calculus of gallbladder without cholecystitis without obstruction: K80.20

## 2016-08-30 HISTORY — PX: CHOLECYSTECTOMY: SHX55

## 2016-08-30 LAB — BASIC METABOLIC PANEL
ANION GAP: 9 (ref 5–15)
BUN: 10 mg/dL (ref 6–20)
CALCIUM: 8.5 mg/dL — AB (ref 8.9–10.3)
CO2: 27 mmol/L (ref 22–32)
Chloride: 101 mmol/L (ref 101–111)
Creatinine, Ser: 0.68 mg/dL (ref 0.44–1.00)
Glucose, Bld: 87 mg/dL (ref 65–99)
Potassium: 3.6 mmol/L (ref 3.5–5.1)
Sodium: 137 mmol/L (ref 135–145)

## 2016-08-30 LAB — CBC
HCT: 35.8 % — ABNORMAL LOW (ref 36.0–46.0)
Hemoglobin: 12 g/dL (ref 12.0–15.0)
MCH: 29.1 pg (ref 26.0–34.0)
MCHC: 33.5 g/dL (ref 30.0–36.0)
MCV: 86.7 fL (ref 78.0–100.0)
PLATELETS: 200 10*3/uL (ref 150–400)
RBC: 4.13 MIL/uL (ref 3.87–5.11)
RDW: 13.8 % (ref 11.5–15.5)
WBC: 6.5 10*3/uL (ref 4.0–10.5)

## 2016-08-30 LAB — HCG, SERUM, QUALITATIVE: Preg, Serum: NEGATIVE

## 2016-08-30 SURGERY — LAPAROSCOPIC CHOLECYSTECTOMY
Anesthesia: General | Site: Abdomen

## 2016-08-30 MED ORDER — LACTATED RINGERS IV SOLN
INTRAVENOUS | Status: DC
  Administered 2016-08-30: 08:00:00 via INTRAVENOUS

## 2016-08-30 MED ORDER — STERILE WATER FOR IRRIGATION IR SOLN
Status: DC | PRN
  Administered 2016-08-30: 1000 mL

## 2016-08-30 MED ORDER — PROMETHAZINE HCL 25 MG/ML IJ SOLN: 6.2500 mg | INTRAMUSCULAR | Status: DC | PRN

## 2016-08-30 MED ORDER — FENTANYL CITRATE (PF) 250 MCG/5ML IJ SOLN
INTRAMUSCULAR | Status: AC
  Filled 2016-08-30: qty 5

## 2016-08-30 MED ORDER — DIPHENHYDRAMINE HCL 50 MG/ML IJ SOLN
INTRAMUSCULAR | Status: DC | PRN
  Administered 2016-08-30: 25 mg via INTRAVENOUS

## 2016-08-30 MED ORDER — GLYCOPYRROLATE 0.2 MG/ML IJ SOLN
INTRAMUSCULAR | Status: DC | PRN
  Administered 2016-08-30 (×2): 0.2 mg via INTRAVENOUS

## 2016-08-30 MED ORDER — OXYCODONE HCL 5 MG PO TABS
ORAL_TABLET | ORAL | Status: AC
  Filled 2016-08-30: qty 2

## 2016-08-30 MED ORDER — HYDROMORPHONE HCL 1 MG/ML IJ SOLN
INTRAMUSCULAR | Status: AC
  Filled 2016-08-30: qty 0.5

## 2016-08-30 MED ORDER — HYDROMORPHONE HCL 1 MG/ML IJ SOLN: 0.2500 mg | INTRAMUSCULAR | Status: DC | PRN

## 2016-08-30 MED ORDER — SODIUM CHLORIDE 0.9% FLUSH: 3.0000 mL | Freq: Two times a day (BID) | INTRAVENOUS | Status: DC

## 2016-08-30 MED ORDER — 0.9 % SODIUM CHLORIDE (POUR BTL) OPTIME
TOPICAL | Status: DC | PRN
  Administered 2016-08-30: 1000 mL

## 2016-08-30 MED ORDER — ROCURONIUM BROMIDE 10 MG/ML (PF) SYRINGE
PREFILLED_SYRINGE | INTRAVENOUS | Status: AC
  Filled 2016-08-30: qty 5

## 2016-08-30 MED ORDER — MORPHINE SULFATE (PF) 2 MG/ML IV SOLN: 2.0000 mg | INTRAVENOUS | Status: DC | PRN

## 2016-08-30 MED ORDER — CHLORHEXIDINE GLUCONATE CLOTH 2 % EX PADS: 6.0000 | MEDICATED_PAD | Freq: Once | CUTANEOUS | Status: DC

## 2016-08-30 MED ORDER — BUPIVACAINE HCL 0.25 % IJ SOLN
INTRAMUSCULAR | Status: DC | PRN
  Administered 2016-08-30: 6 mL

## 2016-08-30 MED ORDER — ACETAMINOPHEN 650 MG RE SUPP: 650.0000 mg | RECTAL | Status: DC | PRN

## 2016-08-30 MED ORDER — SODIUM CHLORIDE 0.9% FLUSH: 3.0000 mL | INTRAVENOUS | Status: DC | PRN

## 2016-08-30 MED ORDER — LIDOCAINE HCL (CARDIAC) 20 MG/ML IV SOLN
INTRAVENOUS | Status: DC | PRN
  Administered 2016-08-30: 100 mg via INTRAVENOUS

## 2016-08-30 MED ORDER — FENTANYL CITRATE (PF) 100 MCG/2ML IJ SOLN
INTRAMUSCULAR | Status: DC | PRN
  Administered 2016-08-30: 100 ug via INTRAVENOUS
  Administered 2016-08-30 (×3): 50 ug via INTRAVENOUS

## 2016-08-30 MED ORDER — ROCURONIUM BROMIDE 100 MG/10ML IV SOLN
INTRAVENOUS | Status: DC | PRN
  Administered 2016-08-30: 50 mg via INTRAVENOUS

## 2016-08-30 MED ORDER — MIDAZOLAM HCL 2 MG/2ML IJ SOLN
INTRAMUSCULAR | Status: AC
  Filled 2016-08-30: qty 2

## 2016-08-30 MED ORDER — HYDROMORPHONE HCL 1 MG/ML IJ SOLN
INTRAMUSCULAR | Status: DC | PRN
  Administered 2016-08-30 (×2): 0.5 mg via INTRAVENOUS

## 2016-08-30 MED ORDER — SUGAMMADEX SODIUM 500 MG/5ML IV SOLN
INTRAVENOUS | Status: DC | PRN
  Administered 2016-08-30: 400 mg via INTRAVENOUS

## 2016-08-30 MED ORDER — MIDAZOLAM HCL 5 MG/5ML IJ SOLN
INTRAMUSCULAR | Status: DC | PRN
  Administered 2016-08-30: 2 mg via INTRAVENOUS

## 2016-08-30 MED ORDER — DEXAMETHASONE SODIUM PHOSPHATE 10 MG/ML IJ SOLN
INTRAMUSCULAR | Status: DC | PRN
  Administered 2016-08-30: 10 mg via INTRAVENOUS

## 2016-08-30 MED ORDER — OXYCODONE HCL 5 MG PO TABS
5.0000 mg | ORAL_TABLET | ORAL | Status: DC | PRN
  Administered 2016-08-30: 10 mg via ORAL

## 2016-08-30 MED ORDER — ONDANSETRON HCL 4 MG/2ML IJ SOLN
INTRAMUSCULAR | Status: DC | PRN
  Administered 2016-08-30: 4 mg via INTRAVENOUS

## 2016-08-30 MED ORDER — HYDROMORPHONE HCL 1 MG/ML IJ SOLN
INTRAMUSCULAR | Status: AC
  Filled 2016-08-30: qty 1

## 2016-08-30 MED ORDER — SODIUM CHLORIDE 0.9 % IR SOLN
Status: DC | PRN
  Administered 2016-08-30: 1000 mL

## 2016-08-30 MED ORDER — CIPROFLOXACIN IN D5W 400 MG/200ML IV SOLN
400.0000 mg | INTRAVENOUS | Status: AC
  Administered 2016-08-30: 400 mg via INTRAVENOUS
  Filled 2016-08-30: qty 200

## 2016-08-30 MED ORDER — PROPOFOL 10 MG/ML IV BOLUS
INTRAVENOUS | Status: AC
  Filled 2016-08-30: qty 40

## 2016-08-30 MED ORDER — PROPOFOL 10 MG/ML IV BOLUS
INTRAVENOUS | Status: DC | PRN
  Administered 2016-08-30: 180 mg via INTRAVENOUS

## 2016-08-30 MED ORDER — BUPIVACAINE HCL (PF) 0.25 % IJ SOLN
INTRAMUSCULAR | Status: AC
  Filled 2016-08-30: qty 30

## 2016-08-30 MED ORDER — ACETAMINOPHEN 325 MG PO TABS: 650.0000 mg | ORAL_TABLET | ORAL | Status: DC | PRN

## 2016-08-30 MED ORDER — OXYCODONE-ACETAMINOPHEN 5-325 MG PO TABS: 1.0000 | ORAL_TABLET | Freq: Four times a day (QID) | ORAL | 0 refills | Status: AC | PRN

## 2016-08-30 MED ORDER — SODIUM CHLORIDE 0.9 % IV SOLN: 250.0000 mL | INTRAVENOUS | Status: DC | PRN

## 2016-08-30 SURGICAL SUPPLY — 42 items
APL SKNCLS STERI-STRIP NONHPOA (GAUZE/BANDAGES/DRESSINGS) ×1
BAG SPEC RTRVL 10 TROC 200 (ENDOMECHANICALS)
BENZOIN TINCTURE PRP APPL 2/3 (GAUZE/BANDAGES/DRESSINGS) ×3 IMPLANT
CANISTER SUCT 3000ML PPV (MISCELLANEOUS) ×3 IMPLANT
CHLORAPREP W/TINT 26ML (MISCELLANEOUS) ×3 IMPLANT
CLIP LIGATING HEMO O LOK GREEN (MISCELLANEOUS) ×3 IMPLANT
CLOSURE STERI-STRIP 1/2X4 (GAUZE/BANDAGES/DRESSINGS) ×1
CLOSURE WOUND 1/2 X4 (GAUZE/BANDAGES/DRESSINGS) ×1
CLSR STERI-STRIP ANTIMIC 1/2X4 (GAUZE/BANDAGES/DRESSINGS) ×2 IMPLANT
COVER SURGICAL LIGHT HANDLE (MISCELLANEOUS) ×3 IMPLANT
COVER TRANSDUCER ULTRASND (DRAPES) ×3 IMPLANT
DEVICE TROCAR PUNCTURE CLOSURE (ENDOMECHANICALS) ×3 IMPLANT
ELECT REM PT RETURN 9FT ADLT (ELECTROSURGICAL) ×3
ELECTRODE REM PT RTRN 9FT ADLT (ELECTROSURGICAL) ×1 IMPLANT
GAUZE SPONGE 2X2 8PLY STRL LF (GAUZE/BANDAGES/DRESSINGS) ×1 IMPLANT
GLOVE BIO SURGEON STRL SZ7.5 (GLOVE) ×3 IMPLANT
GOWN STRL REUS W/ TWL LRG LVL3 (GOWN DISPOSABLE) ×2 IMPLANT
GOWN STRL REUS W/ TWL XL LVL3 (GOWN DISPOSABLE) ×1 IMPLANT
GOWN STRL REUS W/TWL LRG LVL3 (GOWN DISPOSABLE) ×9
GOWN STRL REUS W/TWL XL LVL3 (GOWN DISPOSABLE) ×3
KIT BASIN OR (CUSTOM PROCEDURE TRAY) ×3 IMPLANT
KIT ROOM TURNOVER OR (KITS) ×3 IMPLANT
NDL INSUFFLATION 14GA 120MM (NEEDLE) ×1 IMPLANT
NEEDLE INSUFFLATION 14GA 120MM (NEEDLE) ×3 IMPLANT
NS IRRIG 1000ML POUR BTL (IV SOLUTION) ×3 IMPLANT
PAD ARMBOARD 7.5X6 YLW CONV (MISCELLANEOUS) ×6 IMPLANT
POUCH RETRIEVAL ECOSAC 10 (ENDOMECHANICALS) IMPLANT
POUCH RETRIEVAL ECOSAC 10MM (ENDOMECHANICALS)
SCISSORS LAP 5X35 DISP (ENDOMECHANICALS) ×3 IMPLANT
SET IRRIG TUBING LAPAROSCOPIC (IRRIGATION / IRRIGATOR) ×3 IMPLANT
SLEEVE ENDOPATH XCEL 5M (ENDOMECHANICALS) ×5 IMPLANT
SPECIMEN JAR SMALL (MISCELLANEOUS) ×3 IMPLANT
SPONGE GAUZE 2X2 STER 10/PKG (GAUZE/BANDAGES/DRESSINGS) ×2
STRIP CLOSURE SKIN 1/2X4 (GAUZE/BANDAGES/DRESSINGS) ×2 IMPLANT
SUT MNCRL AB 3-0 PS2 18 (SUTURE) ×3 IMPLANT
TAPE CLOTH SURG 4X10 WHT LF (GAUZE/BANDAGES/DRESSINGS) ×2 IMPLANT
TOWEL OR 17X24 6PK STRL BLUE (TOWEL DISPOSABLE) IMPLANT
TOWEL OR 17X26 10 PK STRL BLUE (TOWEL DISPOSABLE) ×3 IMPLANT
TRAY LAPAROSCOPIC MC (CUSTOM PROCEDURE TRAY) ×3 IMPLANT
TROCAR XCEL NON-BLD 11X100MML (ENDOMECHANICALS) ×3 IMPLANT
TROCAR XCEL NON-BLD 5MMX100MML (ENDOMECHANICALS) ×3 IMPLANT
TUBING INSUFFLATION (TUBING) ×3 IMPLANT

## 2016-08-30 NOTE — Anesthesia Procedure Notes (Signed)
Procedure Name: Intubation Date/Time: 08/30/2016 9:47 AM Performed by: Valda Favia Pre-anesthesia Checklist: Patient identified, Emergency Drugs available, Suction available, Patient being monitored and Timeout performed Patient Re-evaluated:Patient Re-evaluated prior to inductionOxygen Delivery Method: Circle system utilized Preoxygenation: Pre-oxygenation with 100% oxygen Intubation Type: IV induction Ventilation: Mask ventilation without difficulty Laryngoscope Size: Mac and 4 Grade View: Grade I Tube type: Oral Tube size: 7.0 mm Number of attempts: 1 Airway Equipment and Method: Stylet Placement Confirmation: ETT inserted through vocal cords under direct vision,  positive ETCO2 and breath sounds checked- equal and bilateral Secured at: 21 cm Tube secured with: Tape Dental Injury: Teeth and Oropharynx as per pre-operative assessment

## 2016-08-30 NOTE — H&P (Signed)
History of Present Illness  Patient words: NP GB.  The patient is a 36 year old female who presents for evaluation of gall stones. The patient is a 36 year old female with a one-year history of symptomatically cholelithiasis. Patient had epigastric right upper quadrant pain. She states most recently this took her to the ER. She states was wiassociated th nausea and vomiting. She states that the pain is been consistent the right upper quadrant. She states the pain medication is helping with the pain. She states she's been on a bland diet. She does state that is associated with nausea vomiting, fever no diarrhea, no fevers, chills. All other reviews systems are negative.   Past Surgical History Cesarean Section - Multiple   Diagnostic Studies History Colonoscopy  never Mammogram  never Pap Smear  1-5 years ago  Allergies Penicillin G Pot in Dextrose *PENICILLINS*   Medication History  Escitalopram Oxalate (20MG  Tablet, Oral) Active. HydroCHLOROthiazide (12.5MG  Capsule, Oral) Active. Lidocaine Viscous (2% Solution, Mouth/Throat) Active. ValACYclovir HCl (1GM Tablet, Oral as needed) Active. Ondansetron HCl (4MG  Tablet, Oral) Active. Medications Reconciled  Social History  Alcohol use  Occasional alcohol use. Caffeine use  Carbonated beverages, Coffee, Tea. No drug use  Tobacco use  Never smoker.  Family History  Alcohol Abuse  Mother. Depression  Mother. Heart disease in female family member before age 35  Hypertension  Father, Mother. Thyroid problems  Brother, Mother.  Pregnancy / Birth History Age at menarche  12 years. Contraceptive History  Oral contraceptives. Gravida  2 Irregular periods  Length (months) of breastfeeding  7-12 Maternal age  35-20 Para  2  Other Problems  Anxiety Disorder  Depression  High blood pressure  Other disease, cancer, significant illness     Review of Systems General Present- Fever. Not  Present- Appetite Loss, Chills, Fatigue, Night Sweats, Weight Gain and Weight Loss. Skin Not Present- Change in Wart/Mole, Dryness, Hives, Jaundice, New Lesions, Non-Healing Wounds, Rash and Ulcer. HEENT Not Present- Earache, Hearing Loss, Hoarseness, Nose Bleed, Oral Ulcers, Ringing in the Ears, Seasonal Allergies, Sinus Pain, Sore Throat, Visual Disturbances, Wears glasses/contact lenses and Yellow Eyes. Respiratory Not Present- Bloody sputum, Chronic Cough, Difficulty Breathing, Snoring and Wheezing. Breast Not Present- Breast Mass, Breast Pain, Nipple Discharge and Skin Changes. Cardiovascular Not Present- Chest Pain, Difficulty Breathing Lying Down, Leg Cramps, Palpitations, Rapid Heart Rate, Shortness of Breath and Swelling of Extremities. Gastrointestinal Present- Abdominal Pain, Bloating, Change in Bowel Habits, Excessive gas, Gets full quickly at meals, Indigestion, Nausea and Vomiting. Not Present- Bloody Stool, Chronic diarrhea, Constipation, Difficulty Swallowing, Hemorrhoids and Rectal Pain. Female Genitourinary Not Present- Frequency, Nocturia, Painful Urination, Pelvic Pain and Urgency. Musculoskeletal Not Present- Back Pain, Joint Pain, Joint Stiffness, Muscle Pain, Muscle Weakness and Swelling of Extremities. Neurological Not Present- Decreased Memory, Fainting, Headaches, Numbness, Seizures, Tingling, Tremor, Trouble walking and Weakness. Psychiatric Present- Anxiety. Not Present- Bipolar, Change in Sleep Pattern, Depression, Fearful and Frequent crying. Endocrine Not Present- Cold Intolerance, Excessive Hunger, Hair Changes, Heat Intolerance, Hot flashes and New Diabetes. Hematology Not Present- Blood Thinners, Easy Bruising, Excessive bleeding, Gland problems, HIV and Persistent Infections.  BP (!) 163/104   Pulse 78   Temp 98.2 F (36.8 C) (Oral)   Resp 20   LMP 08/08/2016   SpO2 99%    Physical Exam  General Mental Status-Alert. General Appearance-Consistent with  stated age. Hydration-Well hydrated. Voice-Normal.  Head and Neck Head-normocephalic, atraumatic with no lesions or palpable masses.  Eye Eyeball - Bilateral-Extraocular movements intact. Sclera/Conjunctiva -  Bilateral-No scleral icterus.  Chest and Lung Exam Chest and lung exam reveals -quiet, even and easy respiratory effort with no use of accessory muscles. Inspection Chest Wall - Normal. Back - normal.  Cardiovascular Cardiovascular examination reveals -normal heart sounds, regular rate and rhythm with no murmurs.  Abdomen Inspection Normal Exam - No Hernias. Palpation/Percussion Normal exam - Soft, Non Tender, No Rebound tenderness, No Rigidity (guarding) and No hepatosplenomegaly. Auscultation Normal exam - Bowel sounds normal.  Neurologic Neurologic evaluation reveals -alert and oriented x 3 with no impairment of recent or remote memory. Mental Status-Normal.  Musculoskeletal Normal Exam - Left-Upper Extremity Strength Normal and Lower Extremity Strength Normal. Normal Exam - Right-Upper Extremity Strength Normal, Lower Extremity Weakness.    Assessment & Plan  SYMPTOMATIC CHOLELITHIASIS (K80.20) Impression: 36 year old female sent by cholelithiasis 1. We will proceed to the operating room for a laparoscopic cholecystectomy  2. Risks and benefits were discussed with the patient to generally include, but not limited to: infection, bleeding, possible need for post op ERCP, damage to the bile ducts, bile leak, and possible need for further surgery. Alternatives were offered and described. All questions were answered and the patient voiced understanding of the procedure and wishes to proceed at this point with a laparoscopic cholecystectomy

## 2016-08-30 NOTE — Discharge Instructions (Signed)
[facility] ______[facility] °LAPAROSCOPIC SURGERY: POST OP INSTRUCTIONS °Always review your discharge instruction sheet given to you by the facility where your surgery was performed. °IF YOU HAVE DISABILITY OR FAMILY LEAVE FORMS, YOU MUST BRING THEM TO THE OFFICE FOR PROCESSING.   °DO NOT GIVE THEM TO YOUR DOCTOR. ° °1. A prescription for pain medication may be given to you upon discharge.  Take your pain medication as prescribed, if needed.  If narcotic pain medicine is not needed, then you may take acetaminophen (Tylenol) or ibuprofen (Advil) as needed. °2. Take your usually prescribed medications unless otherwise directed. °3. If you need a refill on your pain medication, please contact your pharmacy.  They will contact our office to request authorization. Prescriptions will not be filled after 5pm or on week-ends. °4. You should follow a light diet the first few days after arrival home, such as soup and crackers, etc.  Be sure to include lots of fluids daily. °5. Most patients will experience some swelling and bruising in the area of the incisions.  Ice packs will help.  Swelling and bruising can take several days to resolve.  °6. It is common to experience some constipation if taking pain medication after surgery.  Increasing fluid intake and taking a stool softener (such as Colace) will usually help or prevent this problem from occurring.  A mild laxative (Milk of Magnesia or Miralax) should be taken according to package instructions if there are no bowel movements after 48 hours. °7. Unless discharge instructions indicate otherwise, you may remove your bandages 24-48 hours after surgery, and you may shower at that time.  You may have steri-strips (small skin tapes) in place directly over the incision.  These strips should be left on the skin for 7-10 days.  If your surgeon used skin glue on the incision, you may shower in 24 hours.  The glue will flake off over the next 2-3 weeks.  Any sutures or  staples will be removed at the office during your follow-up visit. °8. ACTIVITIES:  You may resume regular (light) daily activities beginning the next day--such as daily self-care, walking, climbing stairs--gradually increasing activities as tolerated.  You may have sexual intercourse when it is comfortable.  Refrain from any heavy lifting or straining until approved by your doctor. °a. You may drive when you are no longer taking prescription pain medication, you can comfortably wear a seatbelt, and you can safely maneuver your car and apply brakes. °b. RETURN TO WORK:  __________________________________________________________ °9. You should see your doctor in the office for a follow-up appointment approximately 2-3 weeks after your surgery.  Make sure that you call for this appointment within a day or two after you arrive home to insure a convenient appointment time. °10. OTHER INSTRUCTIONS: __________________________________________________________________________________________________________________________ __________________________________________________________________________________________________________________________ °WHEN TO CALL YOUR DOCTOR: °1. Fever over 101.0 °2. Inability to urinate °3. Continued bleeding from incision. °4. Increased pain, redness, or drainage from the incision. °5. Increasing abdominal pain ° °The clinic staff is available to answer your questions during regular business hours.  Please don’t hesitate to call and ask to speak to one of the nurses for clinical concerns.  If you have a medical emergency, go to the nearest emergency room or call 911.  A surgeon from [facility] is always on call at the hospital. °[address], [address], [place], [place]  [address] ? [address] [address], [place], [place]   [address] °[phone] ? [phone] ? FAX [phone] °Web site:   [email or web address] °

## 2016-08-30 NOTE — Anesthesia Postprocedure Evaluation (Addendum)
Anesthesia Post Note  Patient: Dance movement psychotherapistAthena Ramsey Alvarado  Procedure(s) Performed: Procedure(s) (LRB): LAPAROSCOPIC CHOLECYSTECTOMY (N/A)  Patient location during evaluation: PACU Anesthesia Type: General Level of consciousness: awake and lethargic Pain management: pain level controlled Vital Signs Assessment: post-procedure vital signs reviewed and stable Respiratory status: spontaneous breathing, nonlabored ventilation, respiratory function stable and patient connected to nasal cannula oxygen Cardiovascular status: blood pressure returned to baseline and stable Postop Assessment: no signs of nausea or vomiting Anesthetic complications: no       Last Vitals:  Vitals:   08/30/16 1144 08/30/16 1145  BP: (!) 140/99   Pulse: 96 (!) 113  Resp: 15 17  Temp:      Last Pain:  Vitals:   08/30/16 0753  TempSrc: Oral                 [name],JAMES TERRILL

## 2016-08-30 NOTE — Progress Notes (Signed)
Wasted dilaudid .5mg  pulled from pyxis but not given earlier/ witnessed by  Leotis ShamesLauren reynolds rn

## 2016-08-30 NOTE — Op Note (Signed)
08/30/2016  10:41 AM  PATIENT:  [name]  36 y.o. female  PRE-OPERATIVE DIAGNOSIS:  gallstones  POST-OPERATIVE DIAGNOSIS:  gallstones  PROCEDURE:  Procedure(s): LAPAROSCOPIC CHOLECYSTECTOMY (N/A)  SURGEON:  Surgeon(s) and Role:    * Axel Filleramirez, Weston Kallman, MD - Primary  ANESTHESIA:   local and general  EBL:  5cc  BLOOD ADMINISTERED:none  DRAINS: none   LOCAL MEDICATIONS USED:  BUPIVICAINE   SPECIMEN:  Source of Specimen:  gallbladder  DISPOSITION OF SPECIMEN:  PATHOLOGY  COUNTS:  YES  TOURNIQUET:  * No tourniquets in log *  DICTATION: .Dragon Dictation The patient was taken to the operating and placed in the supine position with bilateral SCDs in place. The patient was prepped and draped in the usual sterile fashion. A time out was called and all facts were verified. A pneumoperitoneum was obtained via A Veress needle technique to a pressure of 14mm of mercury.  A 5mm trochar was then placed in the right upper quadrant under visualization, and there were no injuries to any abdominal organs. A 11 mm port was then placed in the umbilical region after infiltrating with local anesthesia under direct visualization. A second and third epigastric port and right lower quadrant port placement under direct visualization, respectively. The liver was very fatty and large. The gallbladder was identified and retracted, the peritoneum was then sharply dissected from the gallbladder and this dissection was carried down to Calot's triangle. The gallbladder was identified and stripped away circumferentially and seen going into the gallbladder 360, the critical angle was obtained.  The cystic duct was very short.  2 clips were placed proximally one distally and the cystic duct transected. The cystic artery was identified and 2 clips placed proximally and one distally and transected. We then proceeded to remove the gallbladder off the hepatic fossa with Bovie cautery. A retrieval bag was then  placed in the abdomen and gallbladder placed in the bag. The hepatic fossa was then reexamined and hemostasis was achieved with Bovie cautery and was excellent at the end of the case. The subhepatic fossa and perihepatic fossa was then irrigated until the effluent was clear.  The gallbladder and bag were removed from the abdominal cavity. The 11 mm trocar fascia was reapproximated with the Endo Close #1 Vicryl x2. The pneumoperitoneum was evacuated and all trochars removed under direct visulalization. The skin was then closed with 4-0 Monocryl and the skin dressed with Steri-Strips, gauze, and tape. The patient was awaken from general anesthesia and taken to the recovery room in stable condition.   PLAN OF CARE: Discharge to home after PACU  PATIENT DISPOSITION:  PACU - hemodynamically stable.   Delay start of Pharmacological VTE agent (>24hrs) due to surgical blood loss or risk of bleeding: not applicable

## 2016-08-30 NOTE — Anesthesia Preprocedure Evaluation (Addendum)
Anesthesia Evaluation  Patient identified by MRN, date of birth, ID band Patient awake    Reviewed: Allergy & Precautions, NPO status , Patient's Chart, lab work & pertinent test results  Airway Mallampati: II   Neck ROM: Full    Dental no notable dental hx.    Pulmonary neg pulmonary ROS,    breath sounds clear to auscultation       Cardiovascular hypertension,  Rhythm:Regular Rate:Normal     Neuro/Psych    GI/Hepatic negative GI ROS, Neg liver ROS,   Endo/Other  Morbid obesity  Renal/GU negative Renal ROS     Musculoskeletal   Abdominal   Peds  Hematology   Anesthesia Other Findings   Reproductive/Obstetrics                            Anesthesia Physical Anesthesia Plan  ASA: II  Anesthesia Plan: General   Post-op Pain Management:    Induction: Intravenous  Airway Management Planned: Oral ETT  Additional Equipment:   Intra-op Plan:   Post-operative Plan: Extubation in OR  Informed Consent: I have reviewed the patients History and Physical, chart, labs and discussed the procedure including the risks, benefits and alternatives for the proposed anesthesia with the patient or authorized representative who has indicated his/her understanding and acceptance.     Plan Discussed with: CRNA  Anesthesia Plan Comments:        Anesthesia Quick Evaluation

## 2016-08-30 NOTE — Transfer of Care (Signed)
Immediate Anesthesia Transfer of Care Note  Patient: Kristine GullyAthena Ramsey Alvarado  Procedure(s) Performed: Procedure(s): LAPAROSCOPIC CHOLECYSTECTOMY (N/A)  Patient Location: PACU  Anesthesia Type:General  Level of Consciousness: awake and alert   Airway & Oxygen Therapy: Patient Spontanous Breathing and Patient connected to nasal cannula oxygen  Post-op Assessment: Report given to RN and Post -op Vital signs reviewed and stable  Post vital signs: Reviewed and stable  Last Vitals:  Vitals:   08/30/16 0803 08/30/16 1054  BP: (!) 163/104 (!) 151/102  Pulse:    Resp:    Temp:  36.4 C    Last Pain:  Vitals:   08/30/16 0753  TempSrc: Oral      Patients Stated Pain Goal: 3 (08/30/16 0731)  Complications: No apparent anesthesia complications

## 2016-08-31 ENCOUNTER — Encounter (HOSPITAL_COMMUNITY): Payer: Self-pay | Admitting: General Surgery

## 2016-09-23 NOTE — Addendum Note (Signed)
Addendum  created 09/23/16 1445 by Icey Tello, MD   Sign clinical note    

## 2016-11-03 ENCOUNTER — Encounter: Payer: Self-pay | Admitting: Adult Health

## 2016-11-08 ENCOUNTER — Other Ambulatory Visit: Payer: Self-pay | Admitting: Adult Health

## 2016-11-08 DIAGNOSIS — R52 Pain, unspecified: Secondary | ICD-10-CM

## 2017-01-01 ENCOUNTER — Other Ambulatory Visit: Payer: Self-pay | Admitting: Adult Health

## 2017-01-01 ENCOUNTER — Other Ambulatory Visit: Payer: Self-pay | Admitting: Family Medicine

## 2017-01-01 DIAGNOSIS — I10 Essential (primary) hypertension: Secondary | ICD-10-CM

## 2017-01-01 DIAGNOSIS — B002 Herpesviral gingivostomatitis and pharyngotonsillitis: Secondary | ICD-10-CM

## 2017-01-05 ENCOUNTER — Other Ambulatory Visit: Payer: Self-pay | Admitting: Adult Health

## 2017-01-05 DIAGNOSIS — B002 Herpesviral gingivostomatitis and pharyngotonsillitis: Secondary | ICD-10-CM

## 2017-01-05 MED ORDER — VALACYCLOVIR HCL 1 G PO TABS: 1000.0000 mg | ORAL_TABLET | Freq: Two times a day (BID) | ORAL | 3 refills | Status: AC

## 2017-01-05 NOTE — Telephone Encounter (Signed)
Patient was on Valtrex and stopped taking this medication, pt is now requesting refills on this Rx, Please Advise

## 2017-01-13 ENCOUNTER — Encounter: Payer: Self-pay | Admitting: Adult Health

## 2017-06-14 LAB — CBC AND DIFFERENTIAL: Hemoglobin: 11.8 — AB (ref 12.0–16.0)

## 2017-06-22 ENCOUNTER — Encounter: Payer: Self-pay | Admitting: Family Medicine

## 2017-09-04 ENCOUNTER — Ambulatory Visit (HOSPITAL_COMMUNITY)
Admission: RE | Admit: 2017-09-04 | Discharge: 2017-09-04 | Disposition: A | Discharge status: Home / Self Care | Payer: Commercial Managed Care - PPO | Source: Ambulatory Visit | Attending: Nurse Practitioner | Admitting: Nurse Practitioner

## 2017-09-04 ENCOUNTER — Other Ambulatory Visit (HOSPITAL_COMMUNITY): Payer: Self-pay | Admitting: Nurse Practitioner

## 2017-09-04 DIAGNOSIS — R05 Cough: Secondary | ICD-10-CM

## 2017-09-04 DIAGNOSIS — R053 Chronic cough: Secondary | ICD-10-CM

## 2017-09-04 DIAGNOSIS — R918 Other nonspecific abnormal finding of lung field: Secondary | ICD-10-CM | POA: Insufficient documentation

## 2017-09-20 ENCOUNTER — Encounter: Payer: Self-pay | Admitting: Adult Health

## 2017-09-20 ENCOUNTER — Ambulatory Visit (INDEPENDENT_AMBULATORY_CARE_PROVIDER_SITE_OTHER): Payer: Commercial Managed Care - PPO | Admitting: Adult Health

## 2017-09-20 VITALS — BP 130/82 | Temp 98.0°F | Wt 277.0 lb

## 2017-09-20 DIAGNOSIS — R5382 Chronic fatigue, unspecified: Secondary | ICD-10-CM | POA: Diagnosis not present

## 2017-09-20 LAB — CBC WITH DIFFERENTIAL/PLATELET
BASOS ABS: 0 10*3/uL (ref 0.0–0.1)
Basophils Relative: 0.5 % (ref 0.0–3.0)
EOS ABS: 0.1 10*3/uL (ref 0.0–0.7)
Eosinophils Relative: 2.1 % (ref 0.0–5.0)
HEMATOCRIT: 35.3 % — AB (ref 36.0–46.0)
Hemoglobin: 11.6 g/dL — ABNORMAL LOW (ref 12.0–15.0)
LYMPHS PCT: 28.3 % (ref 12.0–46.0)
Lymphs Abs: 1.4 10*3/uL (ref 0.7–4.0)
MCHC: 33 g/dL (ref 30.0–36.0)
MCV: 83.3 fl (ref 78.0–100.0)
Monocytes Absolute: 0.4 10*3/uL (ref 0.1–1.0)
Monocytes Relative: 8.4 % (ref 3.0–12.0)
NEUTROS ABS: 3.1 10*3/uL (ref 1.4–7.7)
Neutrophils Relative %: 60.7 % (ref 43.0–77.0)
PLATELETS: 209 10*3/uL (ref 150.0–400.0)
RBC: 4.23 Mil/uL (ref 3.87–5.11)
RDW: 16 % — ABNORMAL HIGH (ref 11.5–15.5)
WBC: 5 10*3/uL (ref 4.0–10.5)

## 2017-09-20 LAB — SEDIMENTATION RATE: SED RATE: 15 mm/h (ref 0–20)

## 2017-09-20 LAB — TSH: TSH: 1.69 u[IU]/mL (ref 0.35–4.50)

## 2017-09-20 LAB — VITAMIN D 25 HYDROXY (VIT D DEFICIENCY, FRACTURES): VITD: 18.2 ng/mL — ABNORMAL LOW (ref 30.00–100.00)

## 2017-09-20 LAB — VITAMIN B12: VITAMIN B 12: 771 pg/mL (ref 211–911)

## 2017-09-20 NOTE — Progress Notes (Signed)
Subjective:    Patient ID: [name], female    DOB: Sep 11, 1980, 37 y.o.   MRN: [id number]  HPI 37 year old female who  has a past medical history of Anemia, Depression, Dysplasia of cervix, Gallstones, Genital herpes, Hypertension, Obesity, and Polycystic ovaries.  She presents to the office today for the acute complaint of not sleeping well and chronic fatigue. Her symptoms have been present for about 7 months. She reports " I am not getting good sleep and when I wake up I feel like I could sleep for another 12 hours."   She denies fevers, night sweats, diarrhea, or constipation. She does report intermittent episodes of nausea.   She does snore at night but has not been told that she holds her breath.   She has been working on weight loss through diet and exercise.   Concerned for tyroid issues d/t family history as well as anemia d/t personal history   Wt Readings from Last 3 Encounters:  09/20/17 277 lb (125.6 kg)  08/12/16 (P) 279 lb 11.2 oz (126.9 kg)  07/18/16 268 lb (121.6 kg)    Review of Systems See HPI  Past Medical History:  Diagnosis Date  . Anemia   . Depression   . Dysplasia of cervix   . Gallstones   . Genital herpes   . Hypertension   . Obesity   . Polycystic ovaries     Social History   Socioeconomic History  . Marital status: Married    Spouse name: Not on file  . Number of children: Not on file  . Years of education: Not on file  . Highest education level: Not on file  Occupational History  . Occupation: Rehab    Employer: [employer]  Social Needs  . Financial resource strain: Not on file  . Food insecurity:    Worry: Not on file    Inability: Not on file  . Transportation needs:    Medical: Not on file    Non-medical: Not on file  Tobacco Use  . Smoking status: Never Smoker  . Smokeless tobacco: Never Used  Substance and Sexual Activity  . Alcohol use: No    Alcohol/week: 0.0 oz  . Drug use: No  . Sexual activity: Yes    Lifestyle  . Physical activity:    Days per week: Not on file    Minutes per session: Not on file  . Stress: Not on file  Relationships  . Social connections:    Talks on phone: Not on file    Gets together: Not on file    Attends religious service: Not on file    Active member of club or organization: Not on file    Attends meetings of clubs or organizations: Not on file    Relationship status: Not on file  . Intimate partner violence:    Fear of current or ex partner: Not on file    Emotionally abused: Not on file    Physically abused: Not on file    Forced sexual activity: Not on file  Other Topics Concern  . Not on file  Social History Narrative      Married for 13 years, 2 children   She likes to ride horses    Past Surgical History:  Procedure Laterality Date  . CESAREAN SECTION  [id number]  . CHOLECYSTECTOMY N/A 08/30/2016   Procedure: LAPAROSCOPIC CHOLECYSTECTOMY;  Surgeon: Axel Filler, MD;  Location: [facility] OR;  Service: General;  Laterality: N/A;  .  COLPOSCOPY  2014  . TUBAL LIGATION  2010    Family History  Problem Relation Age of Onset  . Uterine cancer Maternal Grandmother   . Thyroid disease Maternal Grandmother 25  . Ovarian cancer Maternal Grandmother   . Hypertension Maternal Grandmother   . Breast cancer Paternal Grandmother 18       Breast  . Thyroid disease Mother 59  . Thyroid disease Brother 58  . Breast cancer Other     Allergies  Allergen Reactions  . Penicillins Rash    Current Outpatient Medications on File Prior to Visit  Medication Sig Dispense Refill  . escitalopram (LEXAPRO) 20 MG tablet Take 20 mg by mouth daily.     . hydrochlorothiazide (MICROZIDE) 12.5 MG capsule TAKE ONE CAPSULE BY MOUTH ONCE DAILY 30 capsule 2  . Multiple Vitamin (MULTI-VITAMIN PO) Take 1 tablet by mouth daily.    . valACYclovir (VALTREX) 1000 MG tablet Take 1 tablet (1,000 mg total) by mouth 2 (two) times daily. 20 tablet 3   No current  facility-administered medications on file prior to visit.     BP 130/82   Temp 98 F (36.7 C)   Wt 277 lb (125.6 kg)   BMI (P) 44.71 kg/m       Objective:   Physical Exam  Constitutional: She appears well-developed and well-nourished. No distress.  HENT:  Mouth/Throat: Uvula is midline, oropharynx is clear and moist and mucous membranes are normal.  Neck: Normal range of motion. Neck supple. No thyromegaly present.  Cardiovascular: Normal rate, regular rhythm, normal heart sounds and intact distal pulses. Exam reveals no gallop and no friction rub.  No murmur heard. Pulmonary/Chest: Effort normal and breath sounds normal. No stridor. No respiratory distress. She has no wheezes. She has no rales. She exhibits no tenderness.  Abdominal: Soft. Bowel sounds are normal. She exhibits no distension and no mass. There is no tenderness. There is no rebound and no guarding. No hernia.  Skin: Skin is warm and dry. Capillary refill takes less than 2 seconds. She is not diaphoretic.  Psychiatric: She has a normal mood and affect. Her behavior is normal. Judgment and thought content normal.  Vitals reviewed.     Assessment & Plan:  1. Chronic fatigue - Will check labs ands refer to pulmonary for evaluation of sleep apnea  - TSH - CBC with Differential/Platelet - Iron, TIBC and Ferritin Panel - Vitamin D, 25-hydroxy - Sedimentation Rate - ANA - Ambulatory referral to Pulmonology - Vitamin B12   Shirline Frees, NP

## 2017-09-22 ENCOUNTER — Other Ambulatory Visit: Payer: Self-pay | Admitting: Adult Health

## 2017-09-22 DIAGNOSIS — R768 Other specified abnormal immunological findings in serum: Secondary | ICD-10-CM

## 2017-09-22 LAB — ANTI-NUCLEAR AB-TITER (ANA TITER): ANA Titer 1: 1:80 {titer} — ABNORMAL HIGH

## 2017-09-22 LAB — IRON,TIBC AND FERRITIN PANEL
%SAT: 15 % (calc) (ref 11–50)
FERRITIN: 8 ng/mL — AB (ref 10–154)
IRON: 53 ug/dL (ref 40–190)
TIBC: 342 mcg/dL (calc) (ref 250–450)

## 2017-09-22 LAB — ANA: ANA: POSITIVE — AB

## 2017-10-02 ENCOUNTER — Telehealth: Payer: Self-pay | Admitting: *Deleted

## 2017-10-02 NOTE — Telephone Encounter (Signed)
Copied from CRM [id number]. Topic: General - Other >> Sep 29, 2017  5:03 PM Debroah LoopLander, Lumin L wrote: Reason for CRM: Patient would like a call back from [facility] about lab results at earliest convenience. She reviewed them with someone already but would like to speak specifically with [facility].

## 2017-10-18 ENCOUNTER — Encounter: Payer: Self-pay | Admitting: Adult Health

## 2017-10-20 ENCOUNTER — Ambulatory Visit (INDEPENDENT_AMBULATORY_CARE_PROVIDER_SITE_OTHER): Payer: Commercial Managed Care - PPO | Admitting: Family Medicine

## 2017-10-20 ENCOUNTER — Encounter: Payer: Self-pay | Admitting: Family Medicine

## 2017-10-20 VITALS — BP 138/90 | HR 82 | Temp 98.3°F | Wt 275.0 lb

## 2017-10-20 DIAGNOSIS — R58 Hemorrhage, not elsewhere classified: Secondary | ICD-10-CM

## 2017-10-20 DIAGNOSIS — R768 Other specified abnormal immunological findings in serum: Secondary | ICD-10-CM | POA: Diagnosis not present

## 2017-10-20 NOTE — Progress Notes (Signed)
Subjective:    Patient ID: Kristine GullyAthena Ramsey Alvarado, female    DOB: [birth year], 37 y.o.   MRN: [id number]  No chief complaint on file.   HPI Patient was seen today for acute concern.  Pt endorses 3 areas of ecchymosis her abdomen.  Pt noticed the areas on Monday and was concerned given her recent diagnosis of lupus.  The areas are not painful and pt does not recall any injury to the area.  Pt denies pain, itching, or other symptoms.  Pt has an upcoming appt with Rheumatology.    Pt does note she had several painful sores on her mouth and face last wk that are improving.  Pt has a pic on her phone of the areas.  Pt crying during most of visit.  States she has not cried about the recent information that she has lupus.  States she is frustrated/tired of being sick.  Past Medical History:  Diagnosis Date  . Anemia   . Depression   . Dysplasia of cervix   . Gallstones   . Genital herpes   . Hypertension   . Obesity   . Polycystic ovaries     Allergies  Allergen Reactions  . Penicillins Rash    ROS General: Denies fever, chills, night sweats, changes in weight, changes in appetite HEENT: Denies headaches, ear pain, changes in vision, rhinorrhea, sore throat CV: Denies CP, palpitations, SOB, orthopnea Pulm: Denies SOB, cough, wheezing GI: Denies abdominal pain, nausea, vomiting, diarrhea, constipation GU: Denies dysuria, hematuria, frequency, vaginal discharge Msk: Denies muscle cramps, joint pains Neuro: Denies weakness, numbness, tingling Skin: Denies rashes   + bruising on abdomen Psych: Denies depression, anxiety, hallucinations     Objective:    Blood pressure 138/90, pulse 82, temperature 98.3 F (36.8 C), temperature source Oral, weight 275 lb (124.7 kg), SpO2 96 %.   Gen. Pleasant, well-nourished, in no distress, tearful. HEENT: [place]/AT, face symmetric, no scleral icterus, PERRLA, nares patent without drainage,  Lungs: no accessory muscle use, CTAB, no wheezes or  rales Cardiovascular: RRR, no peripheral edema Neuro:  A&Ox3, CN II-XII intact, normal gait Skin:  Warm, dry, intact.  3 circular areas of ecchymosis on midline abdomen below umbilicus, non tender   Wt Readings from Last 3 Encounters:  10/20/17 275 lb (124.7 kg)  09/20/17 277 lb (125.6 kg)  08/12/16 (P) 279 lb 11.2 oz (126.9 kg)    Lab Results  Component Value Date   WBC 5.0 09/20/2017   HGB 11.6 (L) 09/20/2017   HCT 35.3 (L) 09/20/2017   PLT 209.0 09/20/2017   GLUCOSE 87 08/30/2016   ALT 499 (H) 07/18/2016   AST 449 (H) 07/18/2016   NA 137 08/30/2016   K 3.6 08/30/2016   CL 101 08/30/2016   CREATININE 0.68 08/30/2016   BUN 10 08/30/2016   CO2 27 08/30/2016   TSH 1.69 09/20/2017    Assessment/Plan:  Ecchymosis  -pt reassured  -will continue to monitor -ok to use ice on areas -Pt wishes to wait on labs at this time. -if becomes worse or new areas appear discussed labs.    Positive ANA (antinuclear antibody) -f/u with Rheumatology -given handouts  F/u prn  Abbe AmsterdamShannon Dougles Kimmey, MD

## 2017-10-20 NOTE — Patient Instructions (Signed)
Antinuclear Antibody Test Why am I having this test? This is a test used to help diagnose systemic lupus erythematosus (SLE) and other autoimmune diseases. An autoimmune disease is a disease in which the body's own defense system (immune system) attacks the body. This test checks for antinuclear antibodies (ANA) in the blood. The presence of ANA is associated with several autoimmune diseases. It is most commonly seen with SLE. What kind of sample is taken? A blood sample is required for this test. It is usually collected by inserting a needle into a vein. How do I prepare for this test? There is no preparation required for this test. How are the results reported? Your test results will be reported as either positive or negative. It is your responsibility to obtain your test results. Ask the lab or department performing the test when and how you will get your results. What do the results mean? A positive test may mean you have:  SLE.  An autoimmune disease.  Liver dysfunction.  Leukemia.  Infectious mononucleosis.  Talk with your health care provider to discuss your results, treatment options, and if necessary, the need for more tests. Talk with your health care provider if you have any questions about your results. Talk with your health care provider to discuss your results, treatment options, and if necessary, the need for more tests. Talk with your health care provider if you have any questions about your results. This information is not intended to replace advice given to you by your health care provider. Make sure you discuss any questions you have with your health care provider. Document Released: 05/03/2004 Document Revised: 12/14/2015 Document Reviewed: 09/10/2013 Elsevier Interactive Patient Education  2018 [employer] Systemic Lupus Erythematosus, Adult Systemic lupus erythematosus is a long-term (chronic) disease that can affect many parts of the body. It can damage the skin,  joints, blood vessels, brain, kidneys, lungs, heart, and other internal organs. It causes pain, irritation, and inflammation. Systemic lupus erythematosus is an autoimmune disease. With this type of disease, the body's defense system (immune system) mistakenly attacks normal tissues instead of attacking germs or abnormal growths. What are the causes? The cause of this condition is not known. What increases the risk? This condition is more likely to develop in:  Females.  People of Asian descent.  People of African-American descent.  People who have a family history of the condition.  What are the signs or symptoms? General symptoms include:  Joint pain and swelling (common).  Fever.  Fatigue.  Unusual weight loss or weight gain.  Skin rashes, especially over the nose and cheeks (butterfly rash) and after sun exposure.  Sores inside the mouth or nose.  Other symptoms depend on which parts of the body are affected. They can include:  Shortness of breath.  Chest pain.  Frequent urination.  Blood in the urine.  Seizures.  Mental changes.  Hair loss.  Swollen and tender lymph nodes.  Swelling of the hands or feet.  Symptoms can come and go. A period of time when symptoms get worse or come back is called a flare. A period of time with no symptoms is called a remission. How is this diagnosed? This condition is diagnosed based on symptoms, a medical history, and a physical exam. You may also have tests, including:  Blood tests.  Urine tests.  A chest X-ray.  A skin or kidney biopsy. For this test, a sample of tissue is taken from the skin or kidney and studied under a  microscope.  You may be referred to an autoimmune disease specialist (rheumatologist). How is this treated? There is no cure for this condition, but treatment can keep the disease in remission, help to control symptoms, and prevent damage to the heart, lungs, kidneys, and other organs. Treatment  may involve taking a combination of medicines over time. Follow these instructions at home: Medicines  Take medicines only as directed by your health care provider.  Do not take any medicines that contain estrogen without first checking with your health care provider. Estrogen can trigger flares and may increase your risk for blood clots. Lifestyle  Eat a heart-healthy diet.  Stay active as directed by your health care provider.  Do not smoke. If you need help quitting, ask your health care provider.  Protect your skin from the sun by applying sunblock and wearing protective hats and clothing.  Learn as much as you can about your condition and have a good support system in place. Support may come from family, friends, or a lupus support group. General instructions  Keep all follow-up visits as directed by your health care provider. This is important.  Work closely with all of your health care providers to manage your condition.  Let your health care provider know right away if you become pregnant or if you plan to become pregnant. Pregnancy in women with this condition is considered high risk. Contact a health care provider if:  You have a fever.  Your symptoms flare.  You develop new symptoms.  You develop swollen feet or hands.  You develop puffiness around your eyes.  Your medicines are not working.  You have bloody, foamy, or coffee-colored urine.  There are changes in your urination. For example, you urinate more often at night.  You think that you may be depressed or have anxiety. Get help right away if:  You have chest pain.  You have trouble breathing.  You have a seizure.  You suddenly get a very bad headache.  You suddenly develop facial or body weakness.  You cannot speak.  You cannot understand speech. This information is not intended to replace advice given to you by your health care provider. Make sure you discuss any questions you have with  your health care provider. Document Released: 04/01/2002 Document Revised: 12/06/2015 Document Reviewed: 03/19/2014 Elsevier Interactive Patient Education  [employer].

## 2017-10-23 ENCOUNTER — Encounter: Payer: Self-pay | Admitting: Family Medicine

## 2017-10-24 NOTE — Progress Notes (Signed)
Office Visit Note  Patient: Kristine Gullythena Ramsey Malinoski             Date of Birth: [birth year]           MRN: [id number]             PCP: Shirline FreesNafziger, Cory, NP Referring: Shirline FreesNafziger, Cory, NP Visit Date: 11/02/2017 Occupation: Financial plannerHuman resource analyst    Subjective:  Joint pain and fatigue History of Present Illness: [name] is a 37 y.o. female seen in consultation per request of her PCP.  According to patient her symptoms started about an year ago with increased fatigue and joint pain.  She states she will have bouts of confusion nausea and excessive drowsiness.  She states she was also under a lot of stress.  She had episodes of puffiness of her eyelids, hands and bilateral feet.  She is been increased pain in her bilateral knee joints.  She feels her knees are warm and puffy and swollen.  She has been experiencing a lot of discomfort in her both shoulders, bilateral wrist joints and bilateral knee joints.  She states symptoms are more prominent on the left side of her body.  Activities of Daily Living:  Patient reports morning stiffness all day.   Patient Reports nocturnal pain.  Difficulty dressing/grooming: Denies Difficulty climbing stairs: Reports Difficulty getting out of chair: Denies Difficulty using hands for taps, buttons, cutlery, and/or writing: Denies   Review of Systems  Constitutional: Positive for fatigue. Negative for night sweats, weight gain and weight loss.  HENT: Positive for mouth sores and mouth dryness. Negative for trouble swallowing, trouble swallowing and nose dryness.   Eyes: Positive for dryness. Negative for pain, redness and visual disturbance.  Respiratory: Negative for cough, hemoptysis, shortness of breath and difficulty breathing.   Cardiovascular: Negative for chest pain, palpitations, hypertension, irregular heartbeat and swelling in legs/feet.  Gastrointestinal: Positive for constipation. Negative for abdominal pain, blood in stool and diarrhea.    Endocrine: Negative for increased urination.  Genitourinary: Negative for painful urination, pelvic pain and vaginal dryness.  Musculoskeletal: Positive for arthralgias, joint pain, joint swelling and morning stiffness. Negative for myalgias, muscle weakness, muscle tenderness and myalgias.  Skin: Positive for color change, rash and hair loss. Negative for pallor, nodules/bumps, skin tightness, ulcers and sensitivity to sunlight.  Allergic/Immunologic: Negative for susceptible to infections.  Neurological: Negative for dizziness, light-headedness, numbness, headaches, memory loss, night sweats and weakness.  Hematological: Negative for swollen glands.  Psychiatric/Behavioral: Negative for depressed mood and sleep disturbance. The patient is not nervous/anxious.     PMFS History:  There are no active problems to display for this patient.   Past Medical History:  Diagnosis Date  . Anemia   . Depression   . Dysplasia of cervix   . Gallstones   . Genital herpes   . Hypertension   . Obesity   . Polycystic ovaries     Family History  Problem Relation Age of Onset  . Uterine cancer Maternal Grandmother   . Thyroid disease Maternal Grandmother 25  . Ovarian cancer Maternal Grandmother   . Hypertension Maternal Grandmother   . Breast cancer Paternal Grandmother [id number]       Breast  . Thyroid disease Mother 3030  . Thyroid disease Brother 3324  . Breast cancer Other   . Hypertension Father   . Thyroid disease Brother   . Healthy Daughter   . Healthy Son    Past Surgical History:  Procedure Laterality Date  .  CESAREAN SECTION  [id number]  . CHOLECYSTECTOMY N/A 08/30/2016   Procedure: LAPAROSCOPIC CHOLECYSTECTOMY;  Surgeon: Axel Filler, MD;  Location: [facility] OR;  Service: General;  Laterality: N/A;  . COLPOSCOPY  2014  . TUBAL LIGATION  2010   Social History   Social History Narrative      Married for 13 years, 2 children   She likes to ride horses     Objective: Vital Signs: BP  132/90 (BP Location: Right Arm, Patient Position: Sitting, Cuff Size: Large)   Pulse 75   Resp 15   Ht 5\' 6"  (1.676 m)   Wt 270 lb (122.5 kg)   BMI 43.58 kg/m    Physical Exam  Constitutional: She is oriented to person, place, and time. She appears well-developed and well-nourished.  HENT:  Head: Normocephalic and atraumatic.  Eyes: Conjunctivae and EOM are normal.  Neck: Normal range of motion.  Cardiovascular: Normal rate, regular rhythm, normal heart sounds and intact distal pulses.  Pulmonary/Chest: Effort normal and breath sounds normal.  Abdominal: Soft. Bowel sounds are normal.  Lymphadenopathy:    She has no cervical adenopathy.  Neurological: She is alert and oriented to person, place, and time.  Skin: Skin is warm and dry. Capillary refill takes less than 2 seconds.  Psychiatric: She has a normal mood and affect. Her behavior is normal.  Nursing note and vitals reviewed.    Musculoskeletal Exam: C-spine thoracic lumbar spine good range of motion.  Shoulder joints elbow joints wrist joint MCPs PIPs DIPs were in good range of motion.  She experiences some tenderness across PIPs.  Hip joints knee joints ankles MTPs PIPs were in good range of motion.  She had discomfort with range of motion of bilateral knee joints without any warmth swelling or effusion.   CDAI Exam: No CDAI exam completed.    Investigation: Findings:  09/12/2017: Iron 53, TIBC 342, %saturation 15, ferritin 8, ANA homogeneous 1: 80, vitamin D 18.2, sed rate 15, vitamin B-12 771, TSH 1.69  Component     Latest Ref Rng & Units 09/20/2017  Iron     40 - 190 mcg/dL 53  TIBC     161 - 096 mcg/dL (calc) 045  %SAT     11 - 50 % (calc) 15  Ferritin     10 - 154 ng/mL 8 (L)  ANA Pattern 1      HOMOGENEOUS (A)  ANA Titer 1     titer 1:80 (H)  VITD     30.00 - 100.00 ng/mL 18.20 (L)  Sed Rate     0 - 20 mm/hr 15  Anti Nuclear Antibody(ANA)     NEGATIVE POSITIVE (A)  Vitamin B12     211 - 911 pg/mL  771      Component Value Date/Time   WBC 5.0 09/20/2017 0932   RBC 4.23 09/20/2017 0932   HGB 11.6 (L) 09/20/2017 0932   HCT 35.3 (L) 09/20/2017 0932   PLT 209.0 09/20/2017 0932   MCV 83.3 09/20/2017 0932   MCH 29.1 08/30/2016 0741   MCHC 33.0 09/20/2017 0932   RDW 16.0 (H) 09/20/2017 0932   LYMPHSABS 1.4 09/20/2017 0932   MONOABS 0.4 09/20/2017 0932   EOSABS 0.1 09/20/2017 0932   BASOSABS 0.0 09/20/2017 0932   CMP Latest Ref Rng & Units 08/30/2016 08/12/2016 07/18/2016  Glucose 65 - 99 mg/dL 87 409(W) 87  BUN 6 - 20 mg/dL 10 9 10   Creatinine 0.44 - 1.00 mg/dL 1.19 1.47 8.29  Sodium 135 - 145 mmol/L 137 139 139  Potassium 3.5 - 5.1 mmol/L 3.6 3.7 3.7  Chloride 101 - 111 mmol/L 101 102 102  CO2 22 - 32 mmol/L 27 30 31   Calcium 8.9 - 10.3 mg/dL 4.0(J) 8.9 8.9  Total Protein 6.5 - 8.1 g/dL - - 6.4(L)  Total Bilirubin 0.3 - 1.2 mg/dL - - 1.1  Alkaline Phos 38 - 126 U/L - - 233(H)  AST 15 - 41 U/L - - 449(H)  ALT 14 - 54 U/L - - 499(H)     Imaging: Xr Hand 2 View Left  Result Date: 11/02/2017 MCP PIP or DIP joint space narrowing was noted. Impression unremarkable x-ray of the hand.  Xr Hand 2 View Right  Result Date: 11/02/2017 MCP PIP or DIP joint space narrowing was noted. Impression unremarkable x-ray of the hand.  Xr Knee 3 View Left  Result Date: 11/02/2017 Moderate medial compartment narrowing with medial osteophytes were noted.  No chondrocalcinosis was noted.  Moderate patellofemoral narrowing was noted. Impression: Moderate osteoarthritis and moderate chondromalacia patella.  Xr Knee 3 View Right  Result Date: 11/02/2017 Moderate medial compartment narrowing with medial osteophytes were noted.  No chondrocalcinosis was noted.  Moderate patellofemoral narrowing was noted. Impression: Moderate osteoarthritis and moderate chondromalacia patella.   Speciality Comments: No specialty comments available.    Procedures:  No procedures performed Allergies:  Penicillins   Assessment / Plan:     Visit Diagnoses: Positive ANA (antinuclear antibody) - ANA 1:80 Homogeneous, Sed rate 15.  Patient gives history of arthralgias, hair loss, oral ulcers, Raynauds.  I do not see any synovitis or Raynaud's in examination.  There were no active ulcers today.  I will obtain AVISE labs today.  Pain in both hands - Plan: XR Hand 2 View Right, XR Hand 2 View Left.  The x-rays were unremarkable.  Chronic pain of both knees -patient gives history of intermittent swelling.  She has been having increased pain for the last 1 year.  Plan: XR KNEE 3 VIEW RIGHT, XR KNEE 3 VIEW LEFT.  X-rays showed moderate osteoarthritis and moderate chondromalacia patella.  I have given her knee joint muscle strengthening exercise handout.  A list of natural anti-inflammatories was given.  Class III severe obesity-weight loss diet and exercise will be helpful for the knee joint discomfort.  Essential hypertension-her blood pressure is elevated have advised her to monitor blood pressure closely.  History of polycystic ovaries  History of herpes genitalis  History of depression  History of anemia    Orders: Orders Placed This Encounter  Procedures  . XR Hand 2 View Right  . XR Hand 2 View Left  . XR KNEE 3 VIEW RIGHT  . XR KNEE 3 VIEW LEFT   No orders of the defined types were placed in this encounter.   Face-to-face time spent with patient was 50 minutes. Greater than 50% of time was spent in counseling and coordination of care.  Follow-Up Instructions: Return for +ANA.   Pollyann Savoy, MD  Note - This record has been created using Animal nutritionist.  Chart creation errors have been sought, but may not always  have been located. Such creation errors do not reflect on  the standard of medical care.

## 2017-11-02 ENCOUNTER — Ambulatory Visit (INDEPENDENT_AMBULATORY_CARE_PROVIDER_SITE_OTHER): Payer: Self-pay

## 2017-11-02 ENCOUNTER — Telehealth: Payer: Self-pay | Admitting: Rheumatology

## 2017-11-02 ENCOUNTER — Ambulatory Visit (INDEPENDENT_AMBULATORY_CARE_PROVIDER_SITE_OTHER): Payer: Commercial Managed Care - PPO

## 2017-11-02 ENCOUNTER — Encounter: Payer: Self-pay | Admitting: Rheumatology

## 2017-11-02 ENCOUNTER — Ambulatory Visit (INDEPENDENT_AMBULATORY_CARE_PROVIDER_SITE_OTHER): Payer: Commercial Managed Care - PPO | Admitting: Rheumatology

## 2017-11-02 VITALS — BP 132/90 | HR 75 | Resp 15 | Ht 66.0 in | Wt 270.0 lb

## 2017-11-02 DIAGNOSIS — Z862 Personal history of diseases of the blood and blood-forming organs and certain disorders involving the immune mechanism: Secondary | ICD-10-CM

## 2017-11-02 DIAGNOSIS — R768 Other specified abnormal immunological findings in serum: Secondary | ICD-10-CM | POA: Diagnosis not present

## 2017-11-02 DIAGNOSIS — I10 Essential (primary) hypertension: Secondary | ICD-10-CM | POA: Diagnosis not present

## 2017-11-02 DIAGNOSIS — M25562 Pain in left knee: Secondary | ICD-10-CM

## 2017-11-02 DIAGNOSIS — M79641 Pain in right hand: Secondary | ICD-10-CM

## 2017-11-02 DIAGNOSIS — M79642 Pain in left hand: Secondary | ICD-10-CM

## 2017-11-02 DIAGNOSIS — Z8659 Personal history of other mental and behavioral disorders: Secondary | ICD-10-CM

## 2017-11-02 DIAGNOSIS — Z8742 Personal history of other diseases of the female genital tract: Secondary | ICD-10-CM

## 2017-11-02 DIAGNOSIS — G8929 Other chronic pain: Secondary | ICD-10-CM

## 2017-11-02 DIAGNOSIS — Z6841 Body Mass Index (BMI) 40.0 and over, adult: Secondary | ICD-10-CM

## 2017-11-02 DIAGNOSIS — M25561 Pain in right knee: Secondary | ICD-10-CM

## 2017-11-02 DIAGNOSIS — Z8619 Personal history of other infectious and parasitic diseases: Secondary | ICD-10-CM

## 2017-11-02 NOTE — Telephone Encounter (Signed)
Patient called stating she forgot to get a return to work note before she left.  Patient requested that it be faxed to her at work [phone]

## 2017-11-02 NOTE — Patient Instructions (Signed)
Natural anti-inflammatories  You can purchase these at [employer], [employer] or online.  . Turmeric (capsules)  . Ginger (ginger root or capsules)  . Omega 3 (Fish, flax seeds, chia seeds, walnuts, almonds)  . Tart cherry (dried or extract)   Patient should be under the care of a physician while taking these supplements. This may not be reproduced without the permission of Dr. Axten Pascucci.   Knee Exercises Ask your health care provider which exercises are safe for you. Do exercises exactly as told by your health care provider and adjust them as directed. It is normal to feel mild stretching, pulling, tightness, or discomfort as you do these exercises, but you should stop right away if you feel sudden pain or your pain gets worse.Do not begin these exercises until told by your health care provider. STRETCHING AND RANGE OF MOTION EXERCISES These exercises warm up your muscles and joints and improve the movement and flexibility of your knee. These exercises also help to relieve pain, numbness, and tingling. Exercise A: Knee Extension, Prone 1. Lie on your abdomen on a bed. 2. Place your left / right knee just beyond the edge of the surface so your knee is not on the bed. You can put a towel under your left / right thigh just above your knee for comfort. 3. Relax your leg muscles and allow gravity to straighten your knee. You should feel a stretch behind your left / right knee. 4. Hold this position for __________ seconds. 5. Scoot up so your knee is supported between repetitions. Repeat __________ times. Complete this stretch __________ times a day. Exercise B: Knee Flexion, Active  1. Lie on your back with both knees straight. If this causes back discomfort, bend your left / right knee so your foot is flat on the floor. 2. Slowly slide your left / right heel back toward your buttocks until you feel a gentle stretch in the front of your knee or thigh. 3. Hold this position  for __________ seconds. 4. Slowly slide your left / right heel back to the starting position. Repeat __________ times. Complete this exercise __________ times a day. Exercise C: Quadriceps, Prone  1. Lie on your abdomen on a firm surface, such as a bed or padded floor. 2. Bend your left / right knee and hold your ankle. If you cannot reach your ankle or pant leg, loop a belt around your foot and grab the belt instead. 3. Gently pull your heel toward your buttocks. Your knee should not slide out to the side. You should feel a stretch in the front of your thigh and knee. 4. Hold this position for __________ seconds. Repeat __________ times. Complete this stretch __________ times a day. Exercise D: Hamstring, Supine 1. Lie on your back. 2. Loop a belt or towel over the ball of your left / right foot. The ball of your foot is on the walking surface, right under your toes. 3. Straighten your left / right knee and slowly pull on the belt to raise your leg until you feel a gentle stretch behind your knee. ? Do not let your left / right knee bend while you do this. ? Keep your other leg flat on the floor. 4. Hold this position for __________ seconds. Repeat __________ times. Complete this stretch __________ times a day. STRENGTHENING EXERCISES These exercises build strength and endurance in your knee. Endurance is the ability to use your muscles for a long time, even after they get tired. Exercise   E: Quadriceps, Isometric  1. Lie on your back with your left / right leg extended and your other knee bent. Put a rolled towel or small pillow under your knee if told by your health care provider. 2. Slowly tense the muscles in the front of your left / right thigh. You should see your kneecap slide up toward your hip or see increased dimpling just above the knee. This motion will push the back of the knee toward the floor. 3. For __________ seconds, keep the muscle as tight as you can without increasing  your pain. 4. Relax the muscles slowly and completely. Repeat __________ times. Complete this exercise __________ times a day. Exercise F: Straight Leg Raises - Quadriceps 1. Lie on your back with your left / right leg extended and your other knee bent. 2. Tense the muscles in the front of your left / right thigh. You should see your kneecap slide up or see increased dimpling just above the knee. Your thigh may even shake a bit. 3. Keep these muscles tight as you raise your leg 4-6 inches (10-15 cm) off the floor. Do not let your knee bend. 4. Hold this position for __________ seconds. 5. Keep these muscles tense as you lower your leg. 6. Relax your muscles slowly and completely after each repetition. Repeat __________ times. Complete this exercise __________ times a day. Exercise G: Hamstring, Isometric 1. Lie on your back on a firm surface. 2. Bend your left / right knee approximately __________ degrees. 3. Dig your left / right heel into the surface as if you are trying to pull it toward your buttocks. Tighten the muscles in the back of your thighs to dig as hard as you can without increasing any pain. 4. Hold this position for __________ seconds. 5. Release the tension gradually and allow your muscles to relax completely for __________ seconds after each repetition. Repeat __________ times. Complete this exercise __________ times a day. Exercise H: Hamstring Curls  If told by your health care provider, do this exercise while wearing ankle weights. Begin with __________ weights. Then increase the weight by 1 lb (0.5 kg) increments. Do not wear ankle weights that are more than __________. 1. Lie on your abdomen with your legs straight. 2. Bend your left / right knee as far as you can without feeling pain. Keep your hips flat against the floor. 3. Hold this position for __________ seconds. 4. Slowly lower your leg to the starting position.  Repeat __________ times. Complete this exercise  __________ times a day. Exercise I: Squats (Quadriceps) 1. Stand in front of a table, with your feet and knees pointing straight ahead. You may rest your hands on the table for balance but not for support. 2. Slowly bend your knees and lower your hips like you are going to sit in a chair. ? Keep your weight over your heels, not over your toes. ? Keep your lower legs upright so they are parallel with the table legs. ? Do not let your hips go lower than your knees. ? Do not bend lower than told by your health care provider. ? If your knee pain increases, do not bend as low. 3. Hold the squat position for __________ seconds. 4. Slowly push with your legs to return to standing. Do not use your hands to pull yourself to standing. Repeat __________ times. Complete this exercise __________ times a day. Exercise J: Wall Slides (Quadriceps)  1. Lean your back against a smooth wall or door   while you walk your feet out 18-24 inches (46-61 cm) from it. 2. Place your feet hip-width apart. 3. Slowly slide down the wall or door until your knees bend __________ degrees. Keep your knees over your heels, not over your toes. Keep your knees in line with your hips. 4. Hold for __________ seconds. Repeat __________ times. Complete this exercise __________ times a day. Exercise K: Straight Leg Raises - Hip Abductors 1. Lie on your side with your left / right leg in the top position. Lie so your head, shoulder, knee, and hip line up. You may bend your bottom knee to help you keep your balance. 2. Roll your hips slightly forward so your hips are stacked directly over each other and your left / right knee is facing forward. 3. Leading with your heel, lift your top leg 4-6 inches (10-15 cm). You should feel the muscles in your outer hip lifting. ? Do not let your foot drift forward. ? Do not let your knee roll toward the ceiling. 4. Hold this position for __________ seconds. 5. Slowly return your leg to the starting  position. 6. Let your muscles relax completely after each repetition. Repeat __________ times. Complete this exercise __________ times a day. Exercise L: Straight Leg Raises - Hip Extensors 1. Lie on your abdomen on a firm surface. You can put a pillow under your hips if that is more comfortable. 2. Tense the muscles in your buttocks and lift your left / right leg about 4-6 inches (10-15 cm). Keep your knee straight as you lift your leg. 3. Hold this position for __________ seconds. 4. Slowly lower your leg to the starting position. 5. Let your leg relax completely after each repetition. Repeat __________ times. Complete this exercise __________ times a day. This information is not intended to replace advice given to you by your health care provider. Make sure you discuss any questions you have with your health care provider. Document Released: 02/23/2005 Document Revised: 01/04/2016 Document Reviewed: 02/15/2015 [employer] Interactive Patient Education  2018 [employer]  

## 2017-11-03 ENCOUNTER — Encounter: Payer: Self-pay | Admitting: *Deleted

## 2017-11-03 NOTE — Telephone Encounter (Signed)
Letter faxed to patient.

## 2017-11-13 ENCOUNTER — Encounter: Payer: Self-pay | Admitting: Rheumatology

## 2017-11-24 DIAGNOSIS — M170 Bilateral primary osteoarthritis of knee: Secondary | ICD-10-CM | POA: Insufficient documentation

## 2017-11-24 DIAGNOSIS — M2240 Chondromalacia patellae, unspecified knee: Secondary | ICD-10-CM | POA: Insufficient documentation

## 2017-11-24 DIAGNOSIS — Z8659 Personal history of other mental and behavioral disorders: Secondary | ICD-10-CM | POA: Insufficient documentation

## 2017-11-24 DIAGNOSIS — Z8742 Personal history of other diseases of the female genital tract: Secondary | ICD-10-CM | POA: Insufficient documentation

## 2017-11-24 DIAGNOSIS — Z8619 Personal history of other infectious and parasitic diseases: Secondary | ICD-10-CM | POA: Insufficient documentation

## 2017-11-24 NOTE — Progress Notes (Signed)
Office Visit Note  Patient: [name]             Date of Birth: 11-02-1980           MRN: [id number]             PCP: Shirline Frees, NP Referring: Shirline Frees, NP Visit Date: 12/06/2017 Occupation: @GUAROCC @  Subjective:  Pain in both knees..   History of Present Illness: [name] is a 37 y.o. female with history of arthralgias.  She states she continues to have pain and discomfort in her bilateral knees.  She states she went to [facility] last week and after walking for 10 hours her left knee joint started hurting.  She also believes her left knee joint is swollen.  None of the other joints are painful.  She states she still continues to have some hair loss and bruising.  Activities of Daily Living:  Patient reports morning stiffness for 10 minutes.   Patient Denies nocturnal pain.  Difficulty dressing/grooming: Denies Difficulty climbing stairs: Reports Difficulty getting out of chair: Reports Difficulty using hands for taps, buttons, cutlery, and/or writing: Denies  Review of Systems  Constitutional: Negative for fatigue, night sweats, weight gain and weight loss.  HENT: Negative for mouth sores, trouble swallowing, trouble swallowing, mouth dryness and nose dryness.   Eyes: Negative for pain, redness, visual disturbance and dryness.  Respiratory: Negative for cough, shortness of breath and difficulty breathing.   Cardiovascular: Negative for chest pain, palpitations, hypertension, irregular heartbeat and swelling in legs/feet.  Gastrointestinal: Negative for blood in stool, constipation and diarrhea.  Endocrine: Negative for increased urination.  Genitourinary: Negative for vaginal dryness.  Musculoskeletal: Positive for arthralgias, joint pain, joint swelling and morning stiffness. Negative for myalgias, muscle weakness, muscle tenderness and myalgias.  Skin: Negative for color change, rash, hair loss, skin tightness, ulcers and sensitivity to  sunlight.  Allergic/Immunologic: Negative for susceptible to infections.  Neurological: Negative for dizziness, memory loss, night sweats and weakness.  Hematological: Negative for swollen glands.  Psychiatric/Behavioral: Negative for depressed mood and sleep disturbance. The patient is not nervous/anxious.     PMFS History:  Patient Active Problem List   Diagnosis Date Noted  . Primary osteoarthritis of both knees 11/24/2017  . Chondromalacia of patella 11/24/2017  . History of herpes genitalis 11/24/2017  . History of polycystic ovaries 11/24/2017  . History of depression 11/24/2017    Past Medical History:  Diagnosis Date  . Anemia   . Depression   . Dysplasia of cervix   . Gallstones   . Genital herpes   . Hypertension   . Obesity   . Polycystic ovaries     Family History  Problem Relation Age of Onset  . Uterine cancer Maternal Grandmother   . Thyroid disease Maternal Grandmother 25  . Ovarian cancer Maternal Grandmother   . Hypertension Maternal Grandmother   . Breast cancer Paternal Grandmother 20       Breast  . Thyroid disease Mother 23  . Thyroid disease Brother 40  . Breast cancer Other   . Hypertension Father   . Thyroid disease Brother   . Healthy Daughter   . Healthy Son    Past Surgical History:  Procedure Laterality Date  . CESAREAN SECTION  [id number]  . CHOLECYSTECTOMY N/A 08/30/2016   Procedure: LAPAROSCOPIC CHOLECYSTECTOMY;  Surgeon: Axel Filler, MD;  Location: [facility] OR;  Service: General;  Laterality: N/A;  . COLPOSCOPY  2014  . TUBAL LIGATION  2010  Social History   Social History Narrative      Married for 13 years, 2 children   She likes to ride horses    Objective: Vital Signs: BP (!) 145/88 (BP Location: Left Arm, Patient Position: Sitting, Cuff Size: Normal)   Pulse 73   Resp 14   Ht 5\' 6"  (1.676 m)   Wt 265 lb 3.2 oz (120.3 kg)   LMP 11/23/2017   BMI 42.80 kg/m    Physical Exam  Constitutional: She is oriented to  person, place, and time. She appears well-developed and well-nourished.  HENT:  Head: Normocephalic and atraumatic.  Eyes: Conjunctivae and EOM are normal.  Neck: Normal range of motion.  Cardiovascular: Normal rate, regular rhythm, normal heart sounds and intact distal pulses.  Pulmonary/Chest: Effort normal and breath sounds normal.  Abdominal: Soft. Bowel sounds are normal.  Lymphadenopathy:    She has no cervical adenopathy.  Neurological: She is alert and oriented to person, place, and time.  Skin: Skin is warm and dry. Capillary refill takes less than 2 seconds.  Psychiatric: She has a normal mood and affect. Her behavior is normal.  Nursing note and vitals reviewed.    Musculoskeletal Exam: Spine thoracic lumbar spine good range of motion.  Shoulder joints elbow joints wrist joint MCPs PIPs DIPs were in good range of motion.  Joints knee joints ankles MTPs PIPs were in good range of motion.  She has some warmth on palpation of her left knee joint.  CDAI Exam: No CDAI exam completed.   Investigation: No additional findings.  Imaging: No results found.  Recent Labs: Lab Results  Component Value Date   WBC 5.0 09/20/2017   HGB 11.6 (L) 09/20/2017   PLT 209.0 09/20/2017   NA 137 08/30/2016   K 3.6 08/30/2016   CL 101 08/30/2016   CO2 27 08/30/2016   GLUCOSE 87 08/30/2016   BUN 10 08/30/2016   CREATININE 0.68 08/30/2016   BILITOT 1.1 07/18/2016   ALKPHOS 233 (H) 07/18/2016   AST 449 (H) 07/18/2016   ALT 499 (H) 07/18/2016   PROT 6.4 (L) 07/18/2016   ALBUMIN 3.7 07/18/2016   CALCIUM 8.5 (L) 08/30/2016   GFRAA >60 08/30/2016   AVISE index -1.0 (ANA, double-stranded DNA, Smith, SSA, SSB, RNP, SCL 70, Jo 1-), CB CAP negative, RF negative, anti-CCP negative, anticardiolipin negative, beta-2 GP 1-, antithyroglobulin negative, antithyroid peroxidase negative Speciality Comments: No specialty comments available.  Procedures:  No procedures performed Allergies:  Penicillins   Assessment / Plan:     Visit Diagnoses: Primary osteoarthritis of both knees - Bilateral moderate.  Patient continues to have pain and discomfort in her bilateral knee joints.  Left knee joint is more painful.  Initial counts regarding osteoarthritis was provided.  I discussed the option of having a cortisone injection.  Patient states that due to insurance issues she would like to wait at this point.  She will contact us when she is ready to have left knee joint cortisone injection.  Weight loss diet and exercise was discussed.  She is also taking some natural anti-inflammatories.  I have given her prescription for left knee joint brace.  A prescription for diclofenac gel was also given.  I am concerned that her LFTs were elevated last year.  According to patient that was because of her gallbladder.  I have advised her to get repeat LFTs before she would use diclofenac gel.  She states she will contact her PCP.  Chondromalacia of patella, bilateral -  Moderate.    Positive ANA (antinuclear antibody) - AVISE index -1,  Patient gives history of arthralgias, hair loss, oral ulcer.  Her repeat ANA and double-stranded DNA were negative.  History of herpes genitalis  History of polycystic ovaries  History of depression   Orders: No orders of the defined types were placed in this encounter.  Meds ordered this encounter  Medications  . diclofenac sodium (VOLTAREN) 1 % GEL    Sig: Apply 3 gm to 3 large joints up to 3 times a day.Dispense 3 tubes with 3 refills.    Dispense:  5 Tube    Refill:  0    Face-to-face time spent with patient was 30 minutes. Greater than 50% of time was spent in counseling and coordination of care.  Follow-Up Instructions: Return if symptoms worsen or fail to improve, for Osteoarthritis.   Pollyann SavoyShaili Madhavi Hamblen, MD  Note - This record has been created using [employer] software.  Chart creation errors have been sought, but may not always  have been located.  Such creation errors do not reflect on  the standard of medical care.

## 2017-12-06 ENCOUNTER — Encounter: Payer: Self-pay | Admitting: Rheumatology

## 2017-12-06 ENCOUNTER — Ambulatory Visit (INDEPENDENT_AMBULATORY_CARE_PROVIDER_SITE_OTHER): Payer: Commercial Managed Care - PPO | Admitting: Rheumatology

## 2017-12-06 VITALS — BP 145/88 | HR 73 | Resp 14 | Ht 66.0 in | Wt 265.2 lb

## 2017-12-06 DIAGNOSIS — M2240 Chondromalacia patellae, unspecified knee: Secondary | ICD-10-CM | POA: Diagnosis not present

## 2017-12-06 DIAGNOSIS — Z8619 Personal history of other infectious and parasitic diseases: Secondary | ICD-10-CM

## 2017-12-06 DIAGNOSIS — M170 Bilateral primary osteoarthritis of knee: Secondary | ICD-10-CM | POA: Diagnosis not present

## 2017-12-06 DIAGNOSIS — R7989 Other specified abnormal findings of blood chemistry: Secondary | ICD-10-CM

## 2017-12-06 DIAGNOSIS — Z8659 Personal history of other mental and behavioral disorders: Secondary | ICD-10-CM

## 2017-12-06 DIAGNOSIS — R768 Other specified abnormal immunological findings in serum: Secondary | ICD-10-CM | POA: Diagnosis not present

## 2017-12-06 DIAGNOSIS — R945 Abnormal results of liver function studies: Secondary | ICD-10-CM

## 2017-12-06 DIAGNOSIS — Z8742 Personal history of other diseases of the female genital tract: Secondary | ICD-10-CM

## 2017-12-06 MED ORDER — DICLOFENAC SODIUM 1 % TD GEL: TRANSDERMAL | 0 refills | Status: AC

## 2018-03-13 IMAGING — CT CT NECK W/ CM
3 of 5 series · 12 of 33 positions shown, 14 images · IV contrast (Iodine)
Comparison: None.

CLINICAL DATA: Sore throat

EXAM:
CT NECK WITH CONTRAST
TECHNIQUE: Multidetector CT imaging of the neck was performed using the
standard protocol following the bolus administration of intravenous
contrast.
CONTRAST:  75mL [id number] IOPAMIDOL ([id number]) INJECTION 61%

[orthogonal · axial · 0.49mm/px · z∈[+43,+148]mm · 4 of 101 slices shown, 5 images]
[im 21/101  soft-tissue]
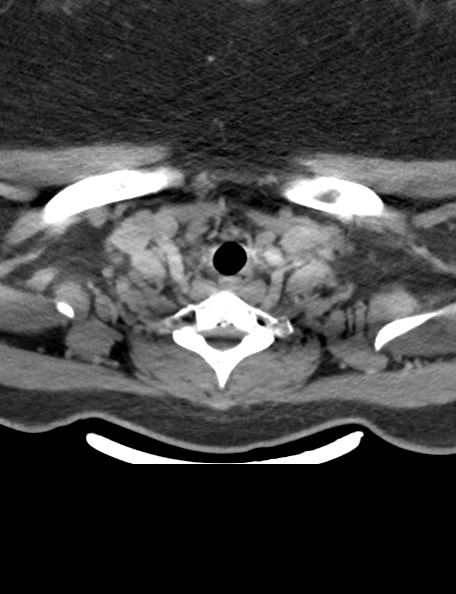
[im 21/101  bone]
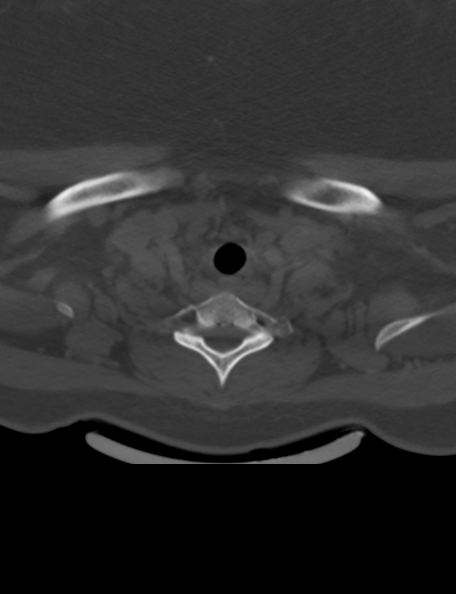
[im 41/101  bone]
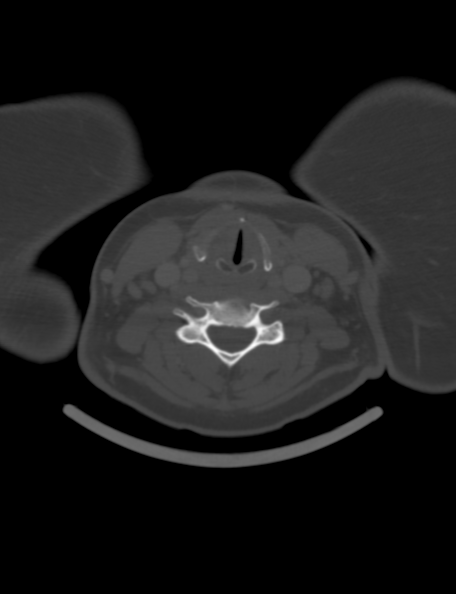
[im 61/101  bone]
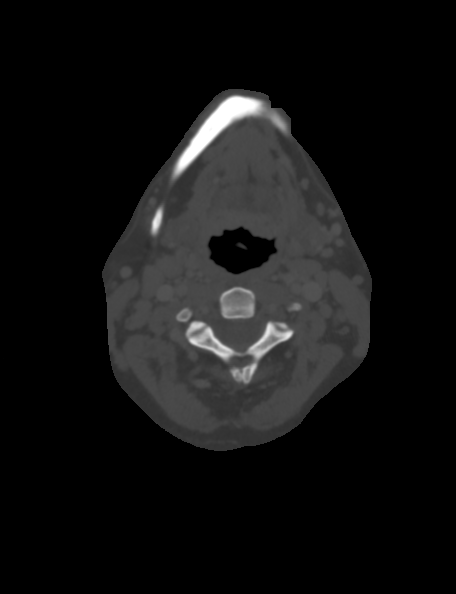
[im 81/101  bone]
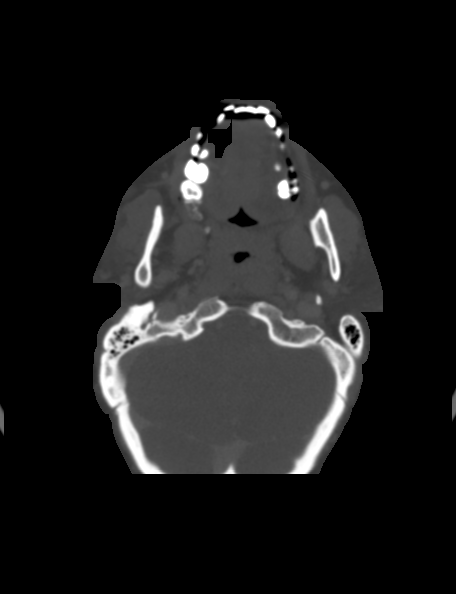

[coronal · coronal · 0.49mm/px · 3 of 90 slices shown]
[im 28/90  bone]
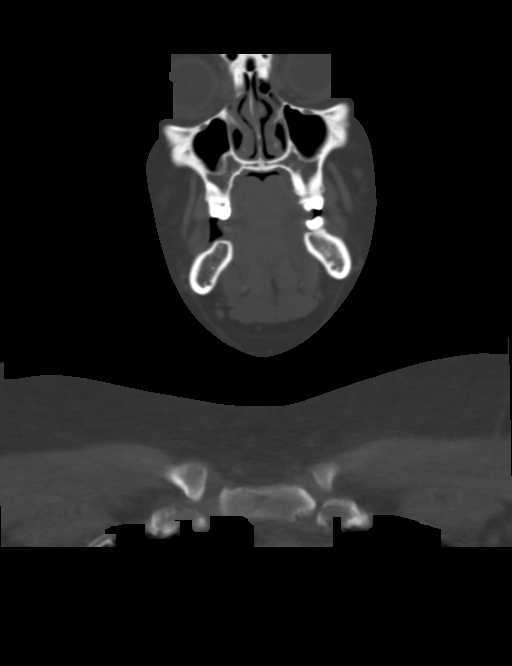
[im 39/90  bone]
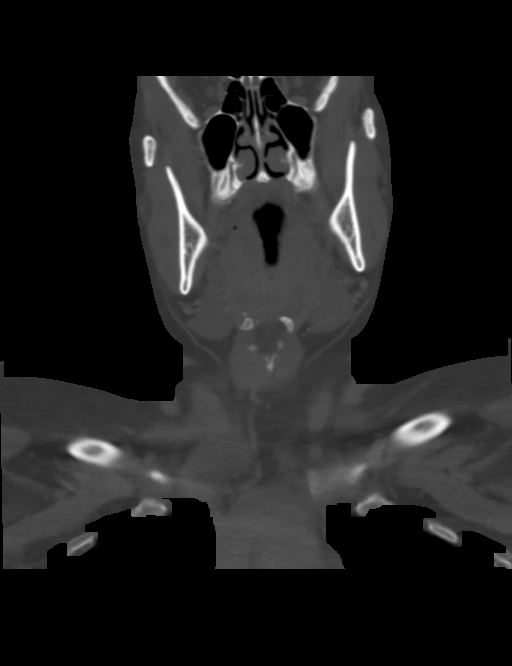
[im 51/90  bone]
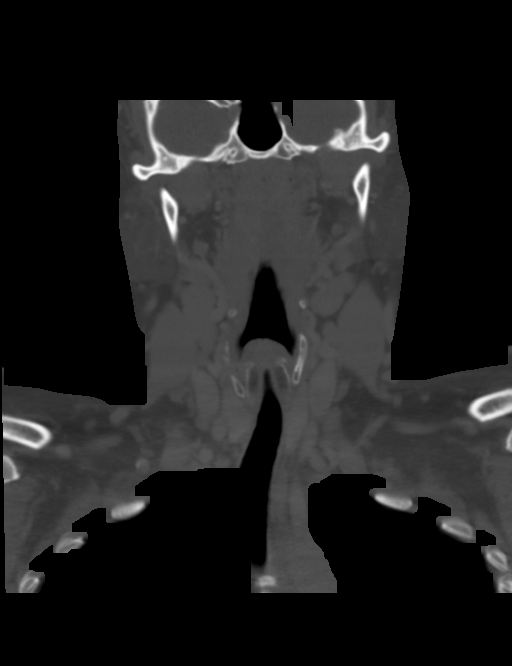

[sagittal · sagittal · 0.45mm/px · 5 of 77 slices shown, 6 images]
[im 26/77  bone]
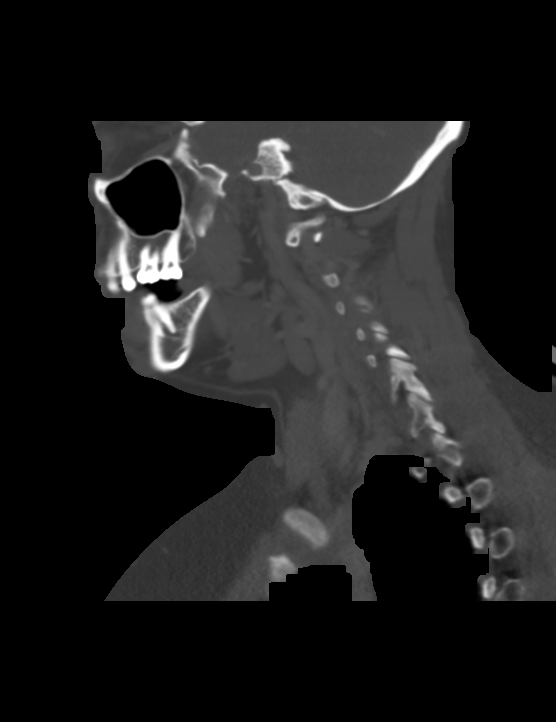
[im 32/77  bone]
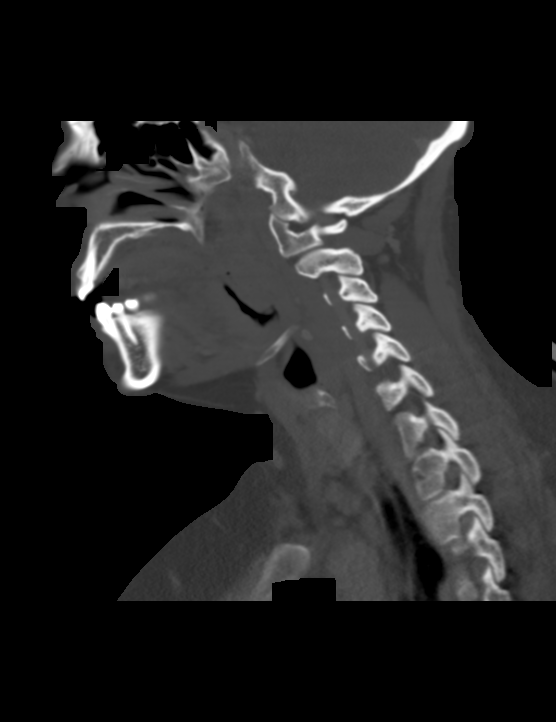
[im 39/77  soft-tissue]
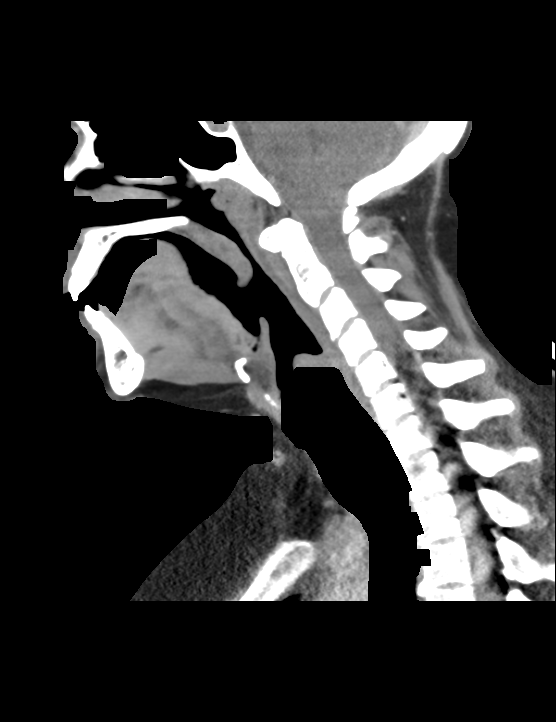
[im 39/77  bone]
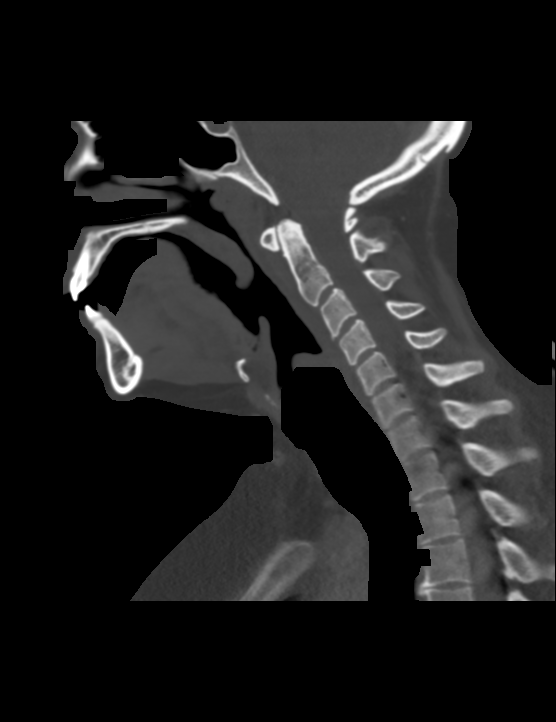
[im 45/77  bone]
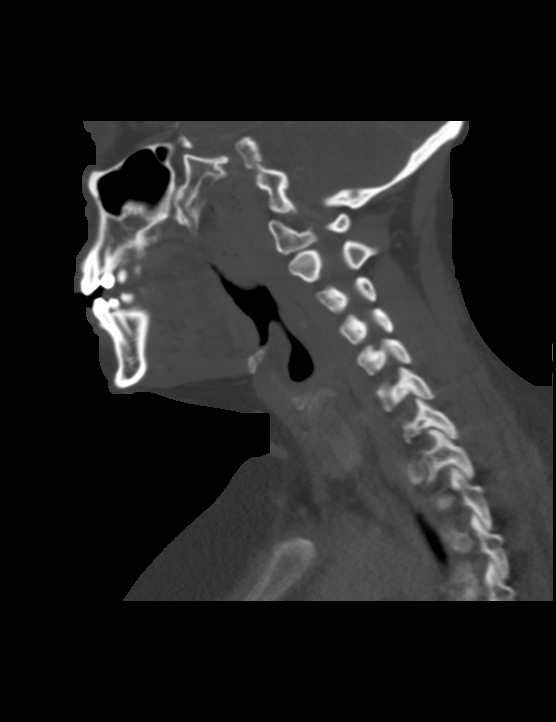
[im 51/77  bone]
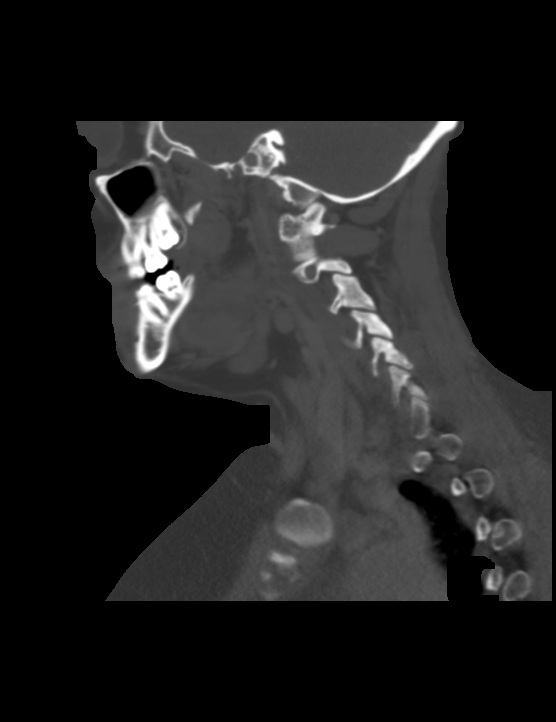

[12 of 33 positions shown; findings below may reference images not displayed]

FINDINGS: Pharynx and larynx: Mild hypertrophy of the tonsils and adenoid
tissue without mass or abscess. Epiglottis and vocal cords normal.

Salivary glands: Parotid and submandibular glands normal bilaterally

Thyroid: Normal

Lymph nodes: Right level 2 node 12.4 mm. Right lower level 2 node 10
mm. Subcentimeter posterior lymph nodes on the right.

Left level 2 node 12 mm.  Subcentimeter posterior nodes on the left.

Vascular: Carotid artery and jugular vein patent bilaterally.

Limited intracranial: Negative

Visualized orbits: Negative

Mastoids and visualized paranasal sinuses: Mild mucosal edema left
maxillary sinus and right mastoid sinus. Remaining sinuses clear.

Skeleton: Negative

Upper chest: Lung apices clear
IMPRESSION: Hypertrophy of the adenoid and tonsils which may be due to
pharyngitis. No abscess or mass. Mild cervical adenopathy most
likely reactive due to pharyngitis.

## 2018-07-25 IMAGING — US US ABDOMEN LIMITED
1 series · 14 of 25 positions shown · non-contrast
Comparison: 06/17/2015

CLINICAL DATA: Epigastric pain x2 weeks

EXAM:
US ABDOMEN LIMITED - RIGHT UPPER QUADRANT

[Series 1: us abdomen limited · 0.25mm/px · 14 of 37 slices shown]
[im 1/37]
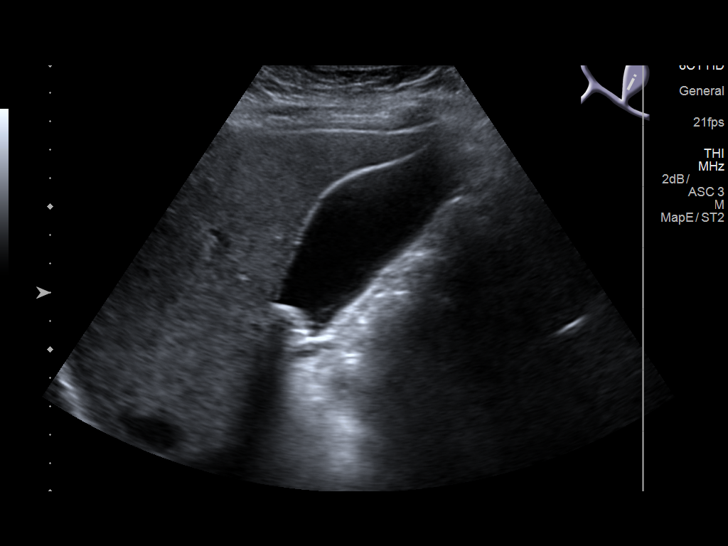
[im 4/37]
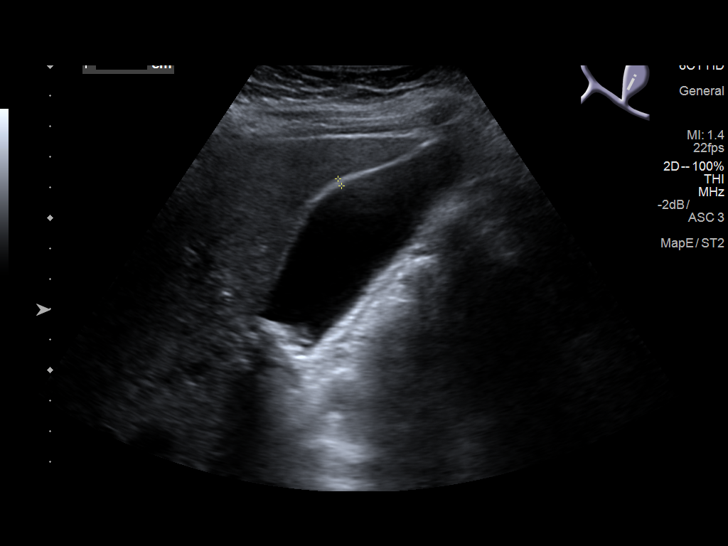
[im 7/37]
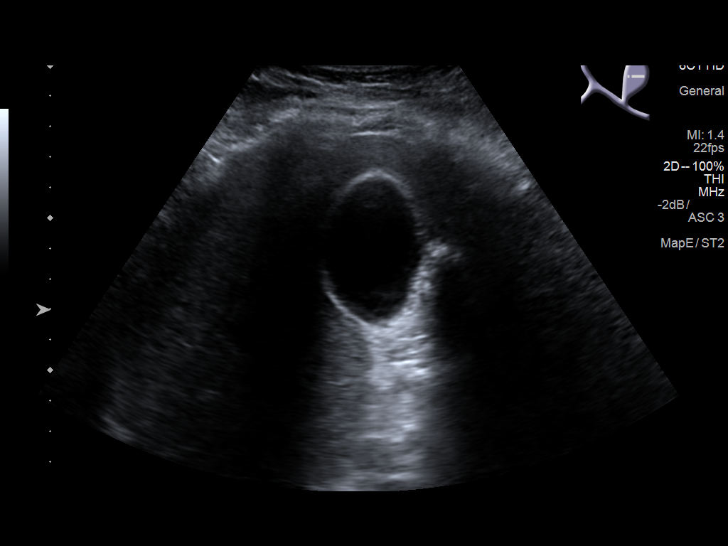
[im 10/37]
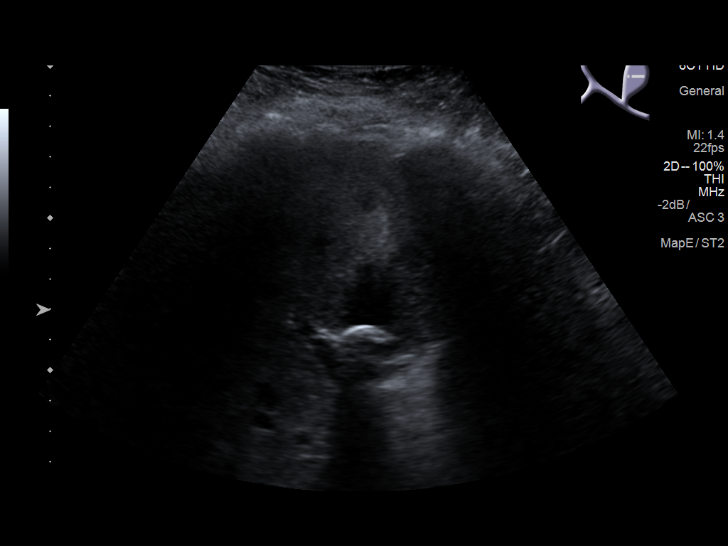
[im 13/37]
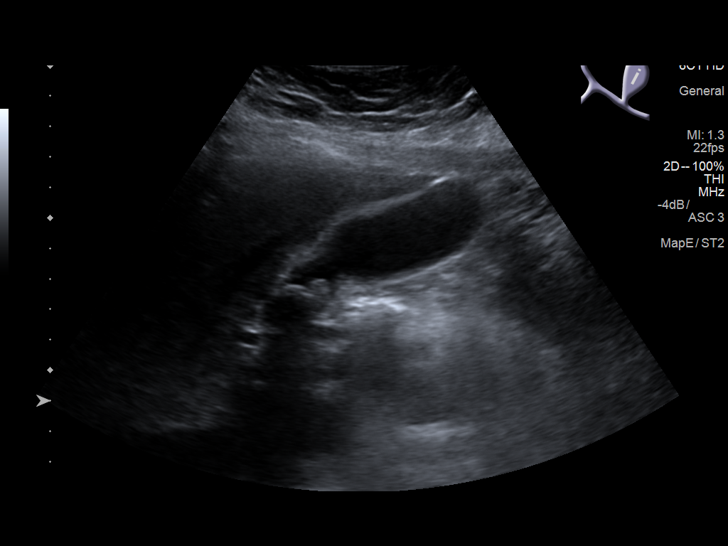
[im 14/37]
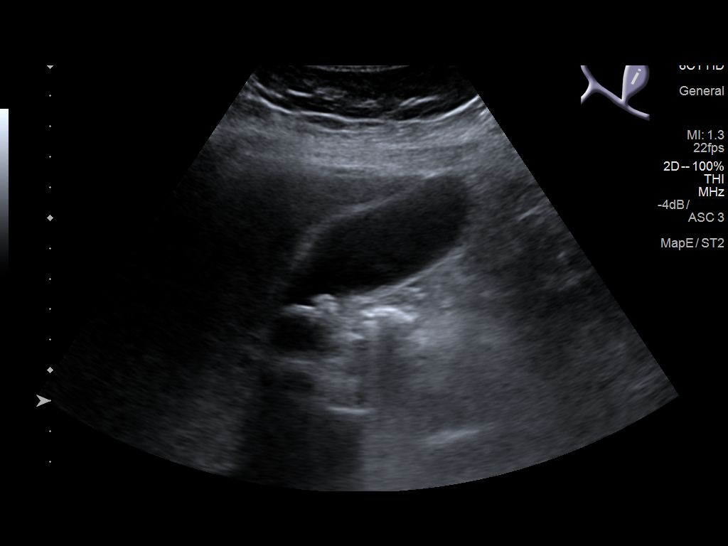
[im 17/37]
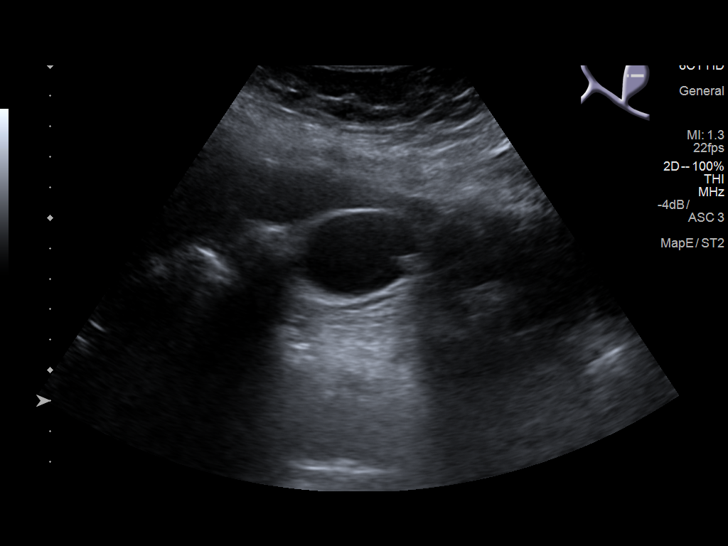
[im 20/37]
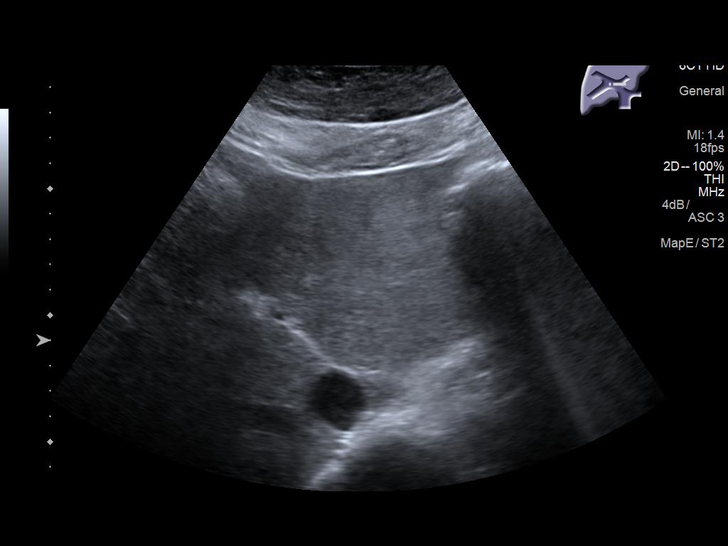
[im 23/37]
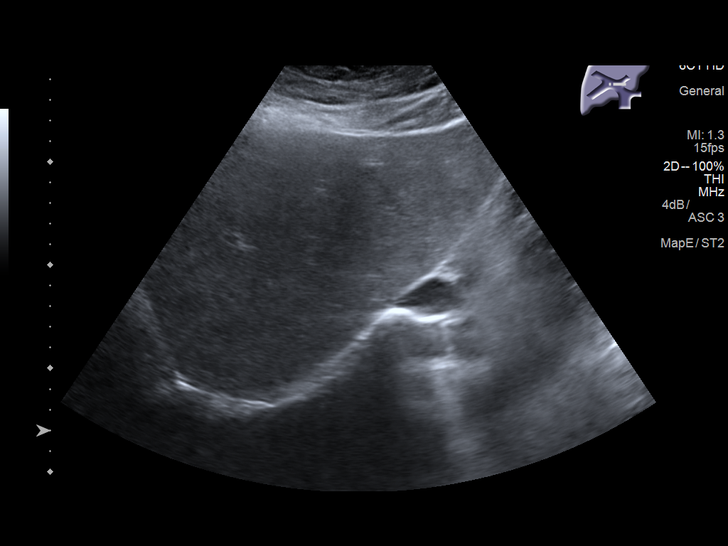
[im 25/37]
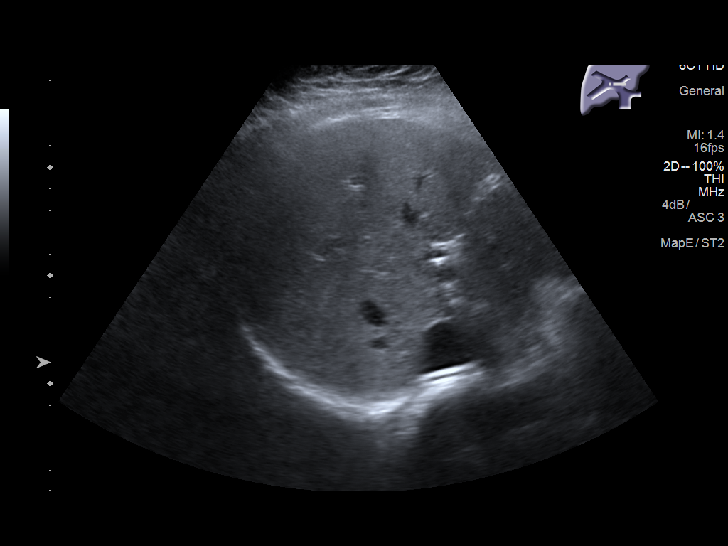
[im 28/37]
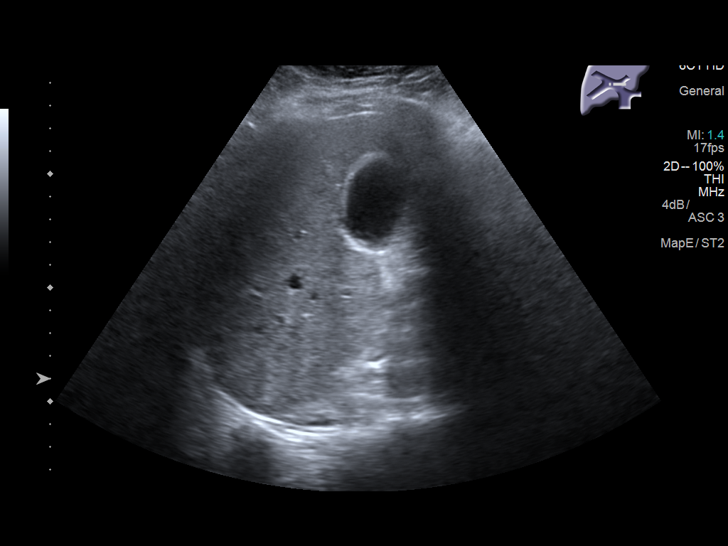
[im 31/37]
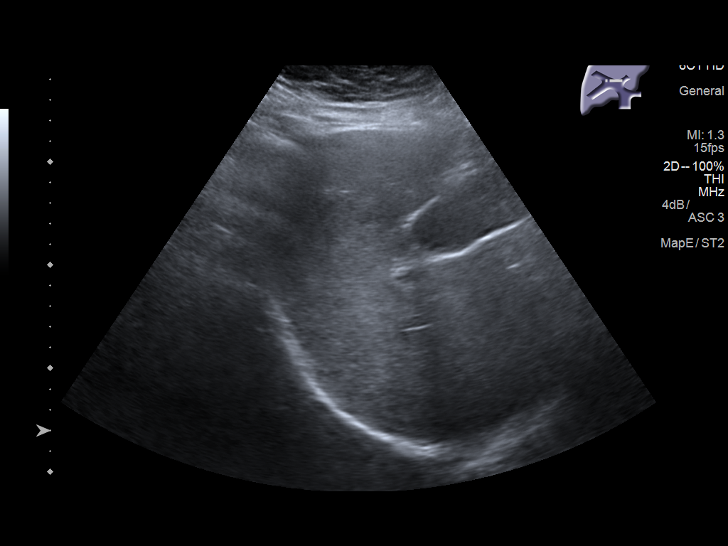
[im 34/37]
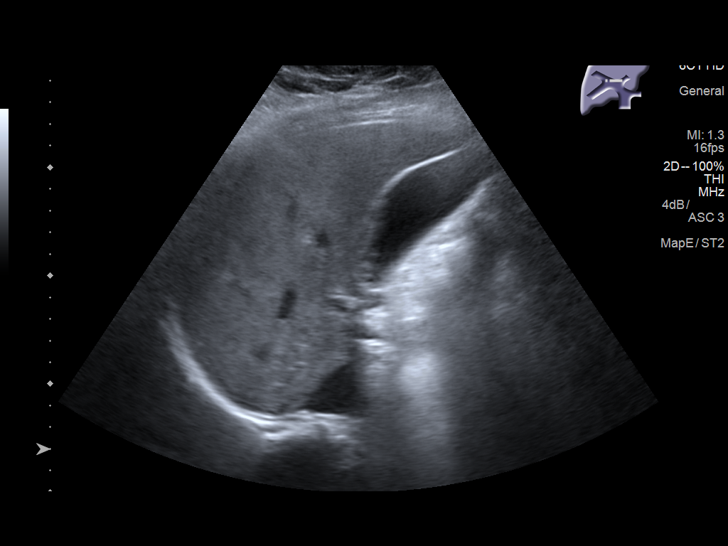
[im 37/37]
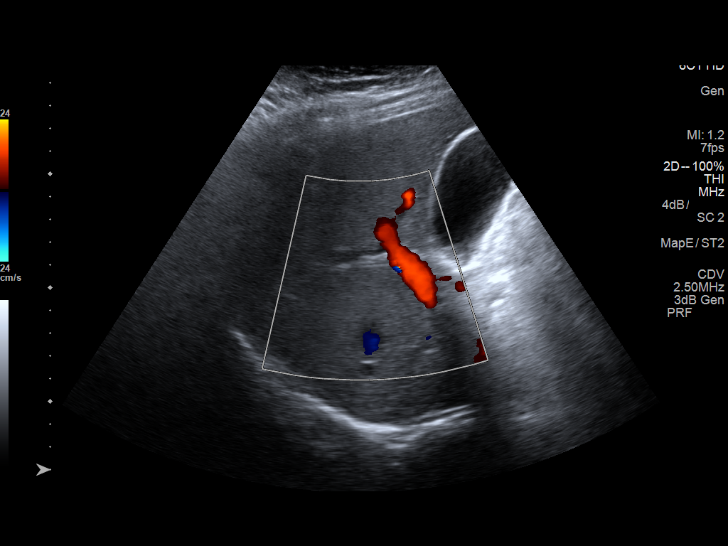

[14 of 25 positions shown; findings below may reference images not displayed]

FINDINGS: Gallbladder:

Gallbladder is physiologically distended without wall thickening.
The gallbladder wall is 2 mm in thickness which is within normal
limits. Cholelithiasis noted at the gallbladder neck the largest is
1.5 cm. No sonographic Murphy sign noted by sonographer.

Common bile duct:

Diameter: 3.6 mm and normal.

Liver:

No focal lesion identified.  Increased echogenicity of the liver.
IMPRESSION: 1. Uncomplicated cholelithiasis. No evidence of acute cholecystitis.
2. Echogenic liver parenchyma consistent with hepatic steatosis.

## 2019-11-28 IMAGING — DX DG CHEST 2V
2 series · 2 of 2 positions shown · non-contrast
Comparison: 12/30/2013

CLINICAL DATA: Persistent cough

EXAM:
CHEST - 2 VIEW

[chest pa]
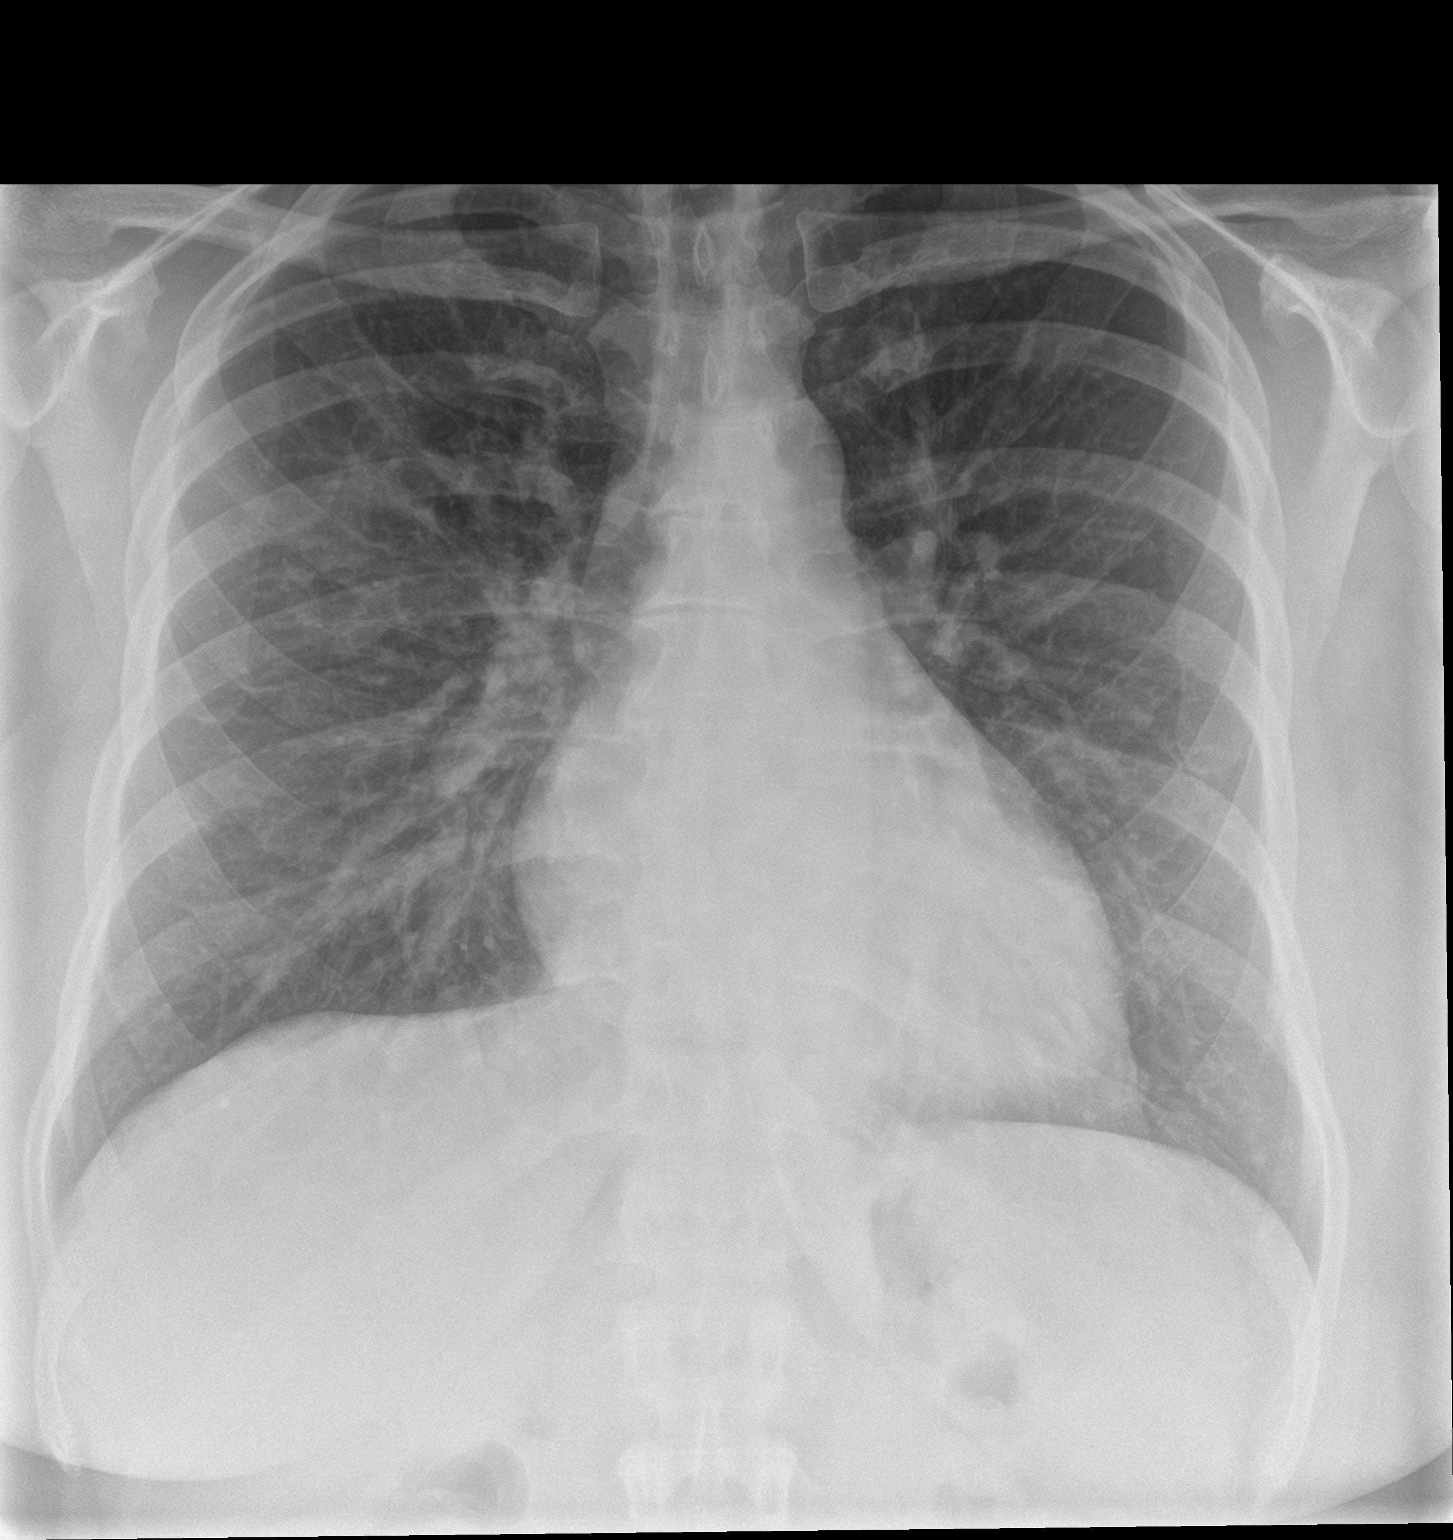

[chest lat]
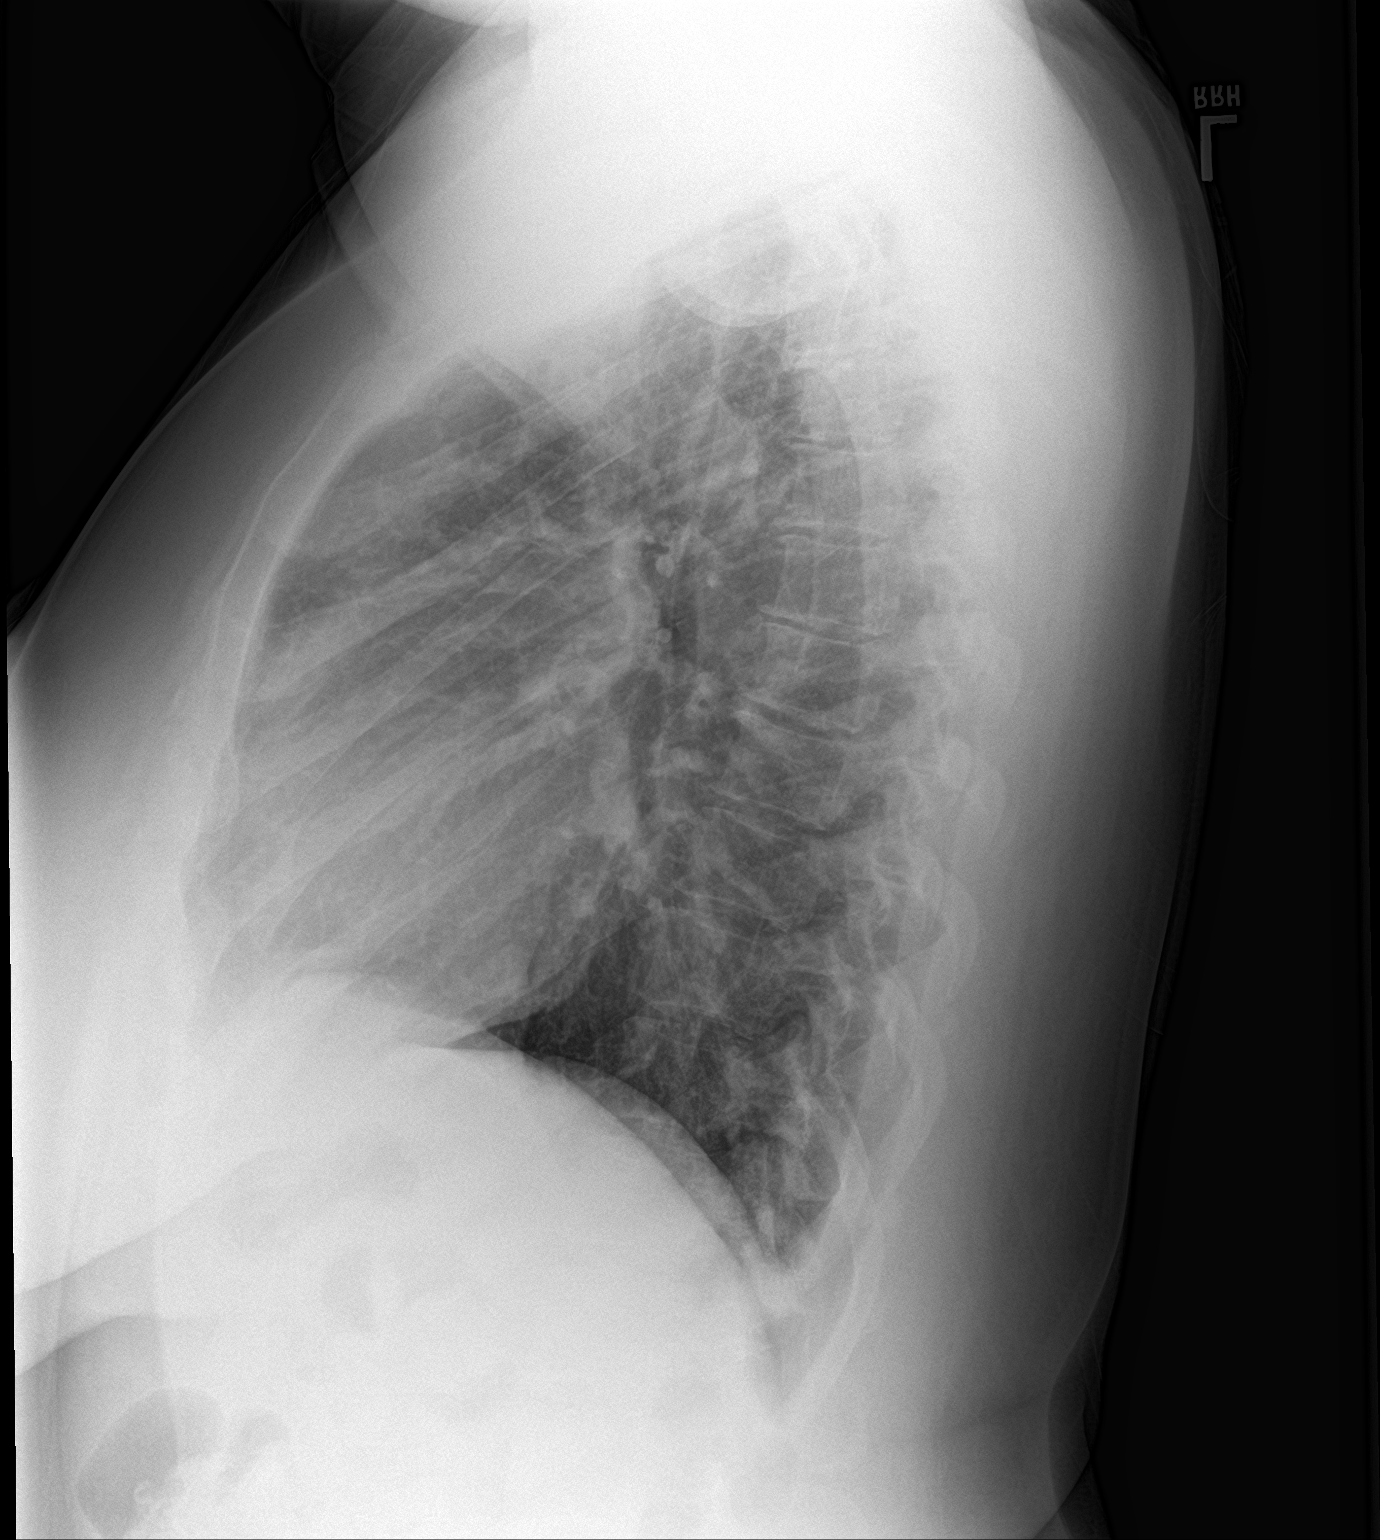

[2 of 2 positions shown; findings below may reference images not displayed]

FINDINGS: Borderline heart size. Negative aortic and hilar contours. Reticular
markings over the right chest. There is no edema, air bronchogram,
effusion, or pneumothorax. Normal aortic and hilar contours.
IMPRESSION: Asymmetric coarsened markings on the right, possible
bronchopneumonia.
